# Patient Record
Sex: Male | Born: 1935 | Race: White | Hispanic: No | Marital: Married | Smoking: Former smoker
Health system: Southern US, Community
[De-identification: ages and names within clinical notes are randomized; demographics above are authoritative.]

## PROBLEM LIST (undated history)

## (undated) DIAGNOSIS — I219 Acute myocardial infarction, unspecified: Secondary | ICD-10-CM

## (undated) DIAGNOSIS — K219 Gastro-esophageal reflux disease without esophagitis: Secondary | ICD-10-CM

## (undated) DIAGNOSIS — I2510 Atherosclerotic heart disease of native coronary artery without angina pectoris: Secondary | ICD-10-CM

## (undated) DIAGNOSIS — E119 Type 2 diabetes mellitus without complications: Secondary | ICD-10-CM

## (undated) DIAGNOSIS — D649 Anemia, unspecified: Secondary | ICD-10-CM

## (undated) DIAGNOSIS — I10 Essential (primary) hypertension: Secondary | ICD-10-CM

## (undated) DIAGNOSIS — I739 Peripheral vascular disease, unspecified: Secondary | ICD-10-CM

## (undated) DIAGNOSIS — I779 Disorder of arteries and arterioles, unspecified: Secondary | ICD-10-CM

## (undated) DIAGNOSIS — I495 Sick sinus syndrome: Secondary | ICD-10-CM

## (undated) DIAGNOSIS — I4891 Unspecified atrial fibrillation: Secondary | ICD-10-CM

## (undated) DIAGNOSIS — I509 Heart failure, unspecified: Secondary | ICD-10-CM

## (undated) DIAGNOSIS — I714 Abdominal aortic aneurysm, without rupture, unspecified: Secondary | ICD-10-CM

## (undated) DIAGNOSIS — R011 Cardiac murmur, unspecified: Secondary | ICD-10-CM

## (undated) DIAGNOSIS — H9390 Unspecified disorder of ear, unspecified ear: Secondary | ICD-10-CM

## (undated) HISTORY — DX: Cardiac murmur, unspecified: R01.1

## (undated) HISTORY — DX: Unspecified atrial fibrillation: I48.91

## (undated) HISTORY — PX: CATARACT EXTRACTION, BILATERAL: SHX1313

## (undated) HISTORY — DX: Heart failure, unspecified: I50.9

## (undated) HISTORY — DX: Abdominal aortic aneurysm, without rupture: I71.4

## (undated) HISTORY — DX: Essential (primary) hypertension: I10

## (undated) HISTORY — DX: Atherosclerotic heart disease of native coronary artery without angina pectoris: I25.10

## (undated) HISTORY — DX: Gastro-esophageal reflux disease without esophagitis: K21.9

## (undated) HISTORY — DX: Anemia, unspecified: D64.9

## (undated) HISTORY — DX: Acute myocardial infarction, unspecified: I21.9

## (undated) HISTORY — DX: Type 2 diabetes mellitus without complications: E11.9

## (undated) HISTORY — PX: NASAL SEPTUM SURGERY: SHX37

## (undated) HISTORY — DX: Abdominal aortic aneurysm, without rupture, unspecified: I71.40

## (undated) HISTORY — DX: Disorder of arteries and arterioles, unspecified: I77.9

## (undated) HISTORY — DX: Sick sinus syndrome: I49.5

## (undated) HISTORY — DX: Peripheral vascular disease, unspecified: I73.9

---

## 1979-06-28 DIAGNOSIS — I2510 Atherosclerotic heart disease of native coronary artery without angina pectoris: Secondary | ICD-10-CM

## 1979-06-28 HISTORY — DX: Atherosclerotic heart disease of native coronary artery without angina pectoris: I25.10

## 1997-06-27 HISTORY — PX: CORONARY ARTERY BYPASS GRAFT: SHX141

## 1998-01-23 ENCOUNTER — Other Ambulatory Visit: Admission: RE | Admit: 1998-01-23 | Discharge: 1998-01-23 | Payer: Self-pay | Admitting: *Deleted

## 1998-06-10 ENCOUNTER — Inpatient Hospital Stay (HOSPITAL_COMMUNITY): Admission: AD | Admit: 1998-06-10 | Discharge: 1998-06-22 | Payer: Self-pay | Admitting: Cardiology

## 1998-06-10 ENCOUNTER — Encounter: Payer: Self-pay | Admitting: Cardiology

## 1998-06-11 ENCOUNTER — Encounter: Payer: Self-pay | Admitting: Thoracic Surgery (Cardiothoracic Vascular Surgery)

## 1998-06-12 ENCOUNTER — Encounter: Payer: Self-pay | Admitting: Thoracic Surgery (Cardiothoracic Vascular Surgery)

## 1998-06-13 ENCOUNTER — Encounter: Payer: Self-pay | Admitting: Thoracic Surgery (Cardiothoracic Vascular Surgery)

## 2004-08-09 ENCOUNTER — Ambulatory Visit (HOSPITAL_COMMUNITY): Admission: RE | Admit: 2004-08-09 | Discharge: 2004-08-09 | Payer: Self-pay | Admitting: Cardiology

## 2005-07-03 ENCOUNTER — Emergency Department (HOSPITAL_COMMUNITY): Admission: EM | Admit: 2005-07-03 | Discharge: 2005-07-03 | Payer: Self-pay | Admitting: Emergency Medicine

## 2006-12-13 ENCOUNTER — Ambulatory Visit: Payer: Self-pay | Admitting: Vascular Surgery

## 2007-06-26 ENCOUNTER — Ambulatory Visit: Payer: Self-pay | Admitting: Vascular Surgery

## 2007-07-18 ENCOUNTER — Encounter: Admission: RE | Admit: 2007-07-18 | Discharge: 2007-07-18 | Payer: Self-pay | Admitting: Internal Medicine

## 2007-08-01 ENCOUNTER — Encounter: Admission: RE | Admit: 2007-08-01 | Discharge: 2007-08-01 | Payer: Self-pay | Admitting: General Surgery

## 2007-08-01 ENCOUNTER — Other Ambulatory Visit: Admission: RE | Admit: 2007-08-01 | Discharge: 2007-08-01 | Payer: Self-pay | Admitting: Interventional Radiology

## 2007-08-01 ENCOUNTER — Encounter (INDEPENDENT_AMBULATORY_CARE_PROVIDER_SITE_OTHER): Payer: Self-pay | Admitting: Interventional Radiology

## 2007-12-04 ENCOUNTER — Ambulatory Visit: Payer: Self-pay | Admitting: Vascular Surgery

## 2008-06-03 ENCOUNTER — Ambulatory Visit: Payer: Self-pay | Admitting: Vascular Surgery

## 2008-12-23 ENCOUNTER — Ambulatory Visit: Payer: Self-pay | Admitting: Vascular Surgery

## 2009-01-27 ENCOUNTER — Ambulatory Visit: Payer: Self-pay | Admitting: Ophthalmology

## 2009-06-08 ENCOUNTER — Ambulatory Visit: Payer: Self-pay | Admitting: Vascular Surgery

## 2009-12-17 ENCOUNTER — Ambulatory Visit: Payer: Self-pay | Admitting: Vascular Surgery

## 2010-07-06 ENCOUNTER — Inpatient Hospital Stay (HOSPITAL_COMMUNITY)
Admission: EM | Admit: 2010-07-06 | Discharge: 2010-07-10 | Payer: Self-pay | Source: Home / Self Care | Attending: Cardiology | Admitting: Cardiology

## 2010-07-07 ENCOUNTER — Encounter (INDEPENDENT_AMBULATORY_CARE_PROVIDER_SITE_OTHER): Payer: Self-pay | Admitting: Emergency Medicine

## 2010-07-08 ENCOUNTER — Encounter: Payer: Self-pay | Admitting: Internal Medicine

## 2010-07-08 DIAGNOSIS — I495 Sick sinus syndrome: Secondary | ICD-10-CM

## 2010-07-08 HISTORY — DX: Sick sinus syndrome: I49.5

## 2010-07-08 HISTORY — PX: PACEMAKER INSERTION: SHX728

## 2010-07-09 ENCOUNTER — Ambulatory Visit: Admit: 2010-07-09 | Payer: Self-pay | Admitting: Vascular Surgery

## 2010-07-12 LAB — GLUCOSE, CAPILLARY
Glucose-Capillary: 134 mg/dL — ABNORMAL HIGH (ref 70–99)
Glucose-Capillary: 115 mg/dL — ABNORMAL HIGH (ref 70–99)
Glucose-Capillary: 100 mg/dL — ABNORMAL HIGH (ref 70–99)
Glucose-Capillary: 68 mg/dL — ABNORMAL LOW (ref 70–99)
Glucose-Capillary: 144 mg/dL — ABNORMAL HIGH (ref 70–99)
Glucose-Capillary: 132 mg/dL — ABNORMAL HIGH (ref 70–99)
Glucose-Capillary: 123 mg/dL — ABNORMAL HIGH (ref 70–99)
Glucose-Capillary: 157 mg/dL — ABNORMAL HIGH (ref 70–99)
Glucose-Capillary: 59 mg/dL — ABNORMAL LOW (ref 70–99)
Glucose-Capillary: 86 mg/dL (ref 70–99)
Glucose-Capillary: 203 mg/dL — ABNORMAL HIGH (ref 70–99)
Glucose-Capillary: 171 mg/dL — ABNORMAL HIGH (ref 70–99)
Glucose-Capillary: 169 mg/dL — ABNORMAL HIGH (ref 70–99)
Glucose-Capillary: 338 mg/dL — ABNORMAL HIGH (ref 70–99)
Glucose-Capillary: 99 mg/dL (ref 70–99)
Glucose-Capillary: 133 mg/dL — ABNORMAL HIGH (ref 70–99)
Glucose-Capillary: 144 mg/dL — ABNORMAL HIGH (ref 70–99)
Glucose-Capillary: 201 mg/dL — ABNORMAL HIGH (ref 70–99)
Glucose-Capillary: 152 mg/dL — ABNORMAL HIGH (ref 70–99)
Glucose-Capillary: 127 mg/dL — ABNORMAL HIGH (ref 70–99)
Glucose-Capillary: 89 mg/dL (ref 70–99)
Glucose-Capillary: 173 mg/dL — ABNORMAL HIGH (ref 70–99)

## 2010-07-12 LAB — BASIC METABOLIC PANEL
BUN: 27 mg/dL — ABNORMAL HIGH (ref 6–23)
BUN: 25 mg/dL — ABNORMAL HIGH (ref 6–23)
CO2: 25 mEq/L (ref 19–32)
CO2: 23 mEq/L (ref 19–32)
Calcium: 9.2 mg/dL (ref 8.4–10.5)
Calcium: 9 mg/dL (ref 8.4–10.5)
Chloride: 103 mEq/L (ref 96–112)
Chloride: 104 mEq/L (ref 96–112)
Creatinine, Ser: 1.45 mg/dL (ref 0.4–1.5)
Creatinine, Ser: 1.44 mg/dL (ref 0.4–1.5)
GFR calc Af Amer: 56 mL/min — ABNORMAL LOW (ref >60)
GFR calc Af Amer: 56 mL/min — ABNORMAL LOW (ref >60)
GFR calc non Af Amer: 46 mL/min — ABNORMAL LOW (ref >60)
GFR calc non Af Amer: 46 mL/min — ABNORMAL LOW (ref >60)
Glucose, Bld: 186 mg/dL — ABNORMAL HIGH (ref 70–99)
Glucose, Bld: 98 mg/dL (ref 70–99)
Potassium: 4.2 mEq/L (ref 3.5–5.1)
Potassium: 4.1 mEq/L (ref 3.5–5.1)
Sodium: 139 mEq/L (ref 135–145)
Sodium: 137 mEq/L (ref 135–145)

## 2010-07-12 LAB — DIFFERENTIAL
Basophils Absolute: 0.1 10*3/uL (ref 0.0–0.1)
Basophils Relative: 1 % (ref 0–1)
Eosinophils Absolute: 0.2 10*3/uL (ref 0.0–0.7)
Eosinophils Relative: 4 % (ref 0–5)
Lymphocytes Relative: 29 % (ref 12–46)
Lymphs Abs: 1.8 10*3/uL (ref 0.7–4.0)
Monocytes Absolute: 0.8 10*3/uL (ref 0.1–1.0)
Monocytes Relative: 13 % — ABNORMAL HIGH (ref 3–12)
Neutro Abs: 3.3 10*3/uL (ref 1.7–7.7)
Neutrophils Relative %: 54 % (ref 43–77)

## 2010-07-12 LAB — CK TOTAL AND CKMB (NOT AT ARMC)
CK, MB: 1.1 ng/mL (ref 0.3–4.0)
CK, MB: 1.2 ng/mL (ref 0.3–4.0)
CK, MB: 1.5 ng/mL (ref 0.3–4.0)
CK, MB: 1.5 ng/mL (ref 0.3–4.0)
Relative Index: INVALID (ref 0.0–2.5)
Relative Index: INVALID (ref 0.0–2.5)
Relative Index: INVALID (ref 0.0–2.5)
Relative Index: INVALID (ref 0.0–2.5)
Total CK: 40 U/L (ref 7–232)
Total CK: 37 U/L (ref 7–232)
Total CK: 36 U/L (ref 7–232)
Total CK: 56 U/L (ref 7–232)

## 2010-07-12 LAB — CBC
HCT: 34.1 % — ABNORMAL LOW (ref 39.0–52.0)
HCT: 33.7 % — ABNORMAL LOW (ref 39.0–52.0)
HCT: 34.4 % — ABNORMAL LOW (ref 39.0–52.0)
HCT: 31.8 % — ABNORMAL LOW (ref 39.0–52.0)
HCT: 29.9 % — ABNORMAL LOW (ref 39.0–52.0)
Hemoglobin: 10.7 g/dL — ABNORMAL LOW (ref 13.0–17.0)
Hemoglobin: 10.9 g/dL — ABNORMAL LOW (ref 13.0–17.0)
Hemoglobin: 11 g/dL — ABNORMAL LOW (ref 13.0–17.0)
Hemoglobin: 10.3 g/dL — ABNORMAL LOW (ref 13.0–17.0)
Hemoglobin: 9.7 g/dL — ABNORMAL LOW (ref 13.0–17.0)
MCH: 27.4 pg (ref 26.0–34.0)
MCH: 27.9 pg (ref 26.0–34.0)
MCH: 27.6 pg (ref 26.0–34.0)
MCH: 27.8 pg (ref 26.0–34.0)
MCH: 27.8 pg (ref 26.0–34.0)
MCHC: 31.4 g/dL (ref 30.0–36.0)
MCHC: 32.3 g/dL (ref 30.0–36.0)
MCHC: 32 g/dL (ref 30.0–36.0)
MCHC: 32.4 g/dL (ref 30.0–36.0)
MCHC: 32.4 g/dL (ref 30.0–36.0)
MCV: 87.4 fL (ref 78.0–100.0)
MCV: 86.2 fL (ref 78.0–100.0)
MCV: 86.4 fL (ref 78.0–100.0)
MCV: 85.9 fL (ref 78.0–100.0)
MCV: 85.7 fL (ref 78.0–100.0)
Platelets: 225 10*3/uL (ref 150–400)
Platelets: 243 10*3/uL (ref 150–400)
Platelets: 243 10*3/uL (ref 150–400)
Platelets: 252 10*3/uL (ref 150–400)
Platelets: 225 10*3/uL (ref 150–400)
RBC: 3.9 MIL/uL — ABNORMAL LOW (ref 4.22–5.81)
RBC: 3.91 MIL/uL — ABNORMAL LOW (ref 4.22–5.81)
RBC: 3.98 MIL/uL — ABNORMAL LOW (ref 4.22–5.81)
RBC: 3.7 MIL/uL — ABNORMAL LOW (ref 4.22–5.81)
RBC: 3.49 MIL/uL — ABNORMAL LOW (ref 4.22–5.81)
RDW: 14.8 % (ref 11.5–15.5)
RDW: 14.8 % (ref 11.5–15.5)
RDW: 14.7 % (ref 11.5–15.5)
RDW: 14.7 % (ref 11.5–15.5)
RDW: 14.8 % (ref 11.5–15.5)
WBC: 6.2 10*3/uL (ref 4.0–10.5)
WBC: 7 10*3/uL (ref 4.0–10.5)
WBC: 8.6 10*3/uL (ref 4.0–10.5)
WBC: 7.5 10*3/uL (ref 4.0–10.5)
WBC: 7.2 10*3/uL (ref 4.0–10.5)

## 2010-07-12 LAB — LIPID PANEL
Cholesterol: 119 mg/dL (ref 0–200)
HDL: 48 mg/dL (ref >39)
LDL Cholesterol: 60 mg/dL (ref 0–99)
Total CHOL/HDL Ratio: 2.5 RATIO
Triglycerides: 54 mg/dL (ref <150)
VLDL: 11 mg/dL (ref 0–40)

## 2010-07-12 LAB — PROTIME-INR
INR: 0.95 (ref 0.00–1.49)
Prothrombin Time: 12.9 seconds (ref 11.6–15.2)

## 2010-07-12 LAB — TROPONIN I
Troponin I: 0.01 ng/mL (ref 0.00–0.06)
Troponin I: 0.03 ng/mL (ref 0.00–0.06)
Troponin I: 0.07 ng/mL — ABNORMAL HIGH (ref 0.00–0.06)
Troponin I: 0.05 ng/mL (ref 0.00–0.06)

## 2010-07-12 LAB — COMPREHENSIVE METABOLIC PANEL
ALT: 10 U/L (ref 0–53)
AST: 15 U/L (ref 0–37)
Albumin: 3.7 g/dL (ref 3.5–5.2)
Alkaline Phosphatase: 48 U/L (ref 39–117)
BUN: 19 mg/dL (ref 6–23)
CO2: 23 mEq/L (ref 19–32)
Calcium: 9.3 mg/dL (ref 8.4–10.5)
Chloride: 107 mEq/L (ref 96–112)
Creatinine, Ser: 1.28 mg/dL (ref 0.4–1.5)
GFR calc Af Amer: >60 mL/min (ref >60)
GFR calc non Af Amer: 53 mL/min — ABNORMAL LOW (ref >60)
Glucose, Bld: 182 mg/dL — ABNORMAL HIGH (ref 70–99)
Potassium: 4 mEq/L (ref 3.5–5.1)
Sodium: 139 mEq/L (ref 135–145)
Total Bilirubin: 0.5 mg/dL (ref 0.3–1.2)
Total Protein: 6.4 g/dL (ref 6.0–8.3)

## 2010-07-12 LAB — HEPARIN LEVEL (UNFRACTIONATED)
Heparin Unfractionated: 0.5 IU/mL (ref 0.30–0.70)
Heparin Unfractionated: 0.32 IU/mL (ref 0.30–0.70)

## 2010-07-12 LAB — CARDIAC PANEL(CRET KIN+CKTOT+MB+TROPI)
CK, MB: 1.7 ng/mL (ref 0.3–4.0)
Relative Index: INVALID (ref 0.0–2.5)
Total CK: 45 U/L (ref 7–232)
Troponin I: 0.05 ng/mL (ref 0.00–0.06)

## 2010-07-12 LAB — APTT: aPTT: 42 seconds — ABNORMAL HIGH (ref 24–37)

## 2010-07-12 LAB — TSH: TSH: 0.369 u[IU]/mL (ref 0.350–4.500)

## 2010-07-12 LAB — HEMOGLOBIN A1C
Hgb A1c MFr Bld: 7.2 % — ABNORMAL HIGH (ref <5.7)
Mean Plasma Glucose: 160 mg/dL — ABNORMAL HIGH (ref <117)

## 2010-07-12 LAB — MAGNESIUM: Magnesium: 2.1 mg/dL (ref 1.5–2.5)

## 2010-07-19 NOTE — Discharge Summary (Addendum)
Steven Callahan, MEHRER NO.:  [id number]  MEDICAL RECORD NO.:  [id number]          Callahan TYPE:  INP  LOCATION:  2014                         FACILITY:  [facility]  PHYSICIAN:  Steven Pick. Eden Emms, Steven Callahan, Steven Callahan:  02-08-1921  DATE OF ADMISSION:  07/06/2010 DATE OF DISCHARGE:  07/10/2010                              DISCHARGE SUMMARY   DISCHARGE DIAGNOSES: 1. New onset atrial fibrillation with rapid ventricular response.     a.     Spontaneously converted to normal sinus rhythm, but had      significant bradycardia/pauses of 3-4 seconds with conversion,      status post Medtronic Adapta pacemaker implantation on July 07, 2010.     b.     Initiated on Pradaxa this admission. 2. Hypertension. 3. Chronic anxiety. 4. History bradycardia. 5. Coronary artery disease, status post remote coronary artery bypass     grafting in 1999. 6. Totally occluded left carotid artery. 7. Diabetes mellitus.8. Anemia with discharge hemoglobin of 9.7. 9. Status post nasal deviation surgery. 10.Status post cataract surgery.  HOSPITAL COURSE:  Steven Callahan is an 77 year old gentleman with a prior history of CAD and hypertension who was admitted with complaints of nausea and chest discomfort.  He took 2 nitroglycerin, but felt sick. He presented to Steven ER and his heart rate was in Steven 130s.  He was found to be in rapid AF and treated with IV Cardizem.  He had a 3.6-second pause, then a sinus beat, then a 4.2-second pause prior to conversion. Steven Callahan felt presyncopal at that time.  Initial changes were made to his medications on admission including reducing his metoprolol and diltiazem.  He was started on heparin.  His rate was monitored on telemetry and he was followed by Steven Callahan on Steven day of admission in Steven following day.  He was started on Pradaxa for CVA precaution.  Steven Callahan has seen Steven Callahan and felt that he would benefit from pacemaker implantation.  Steven  Callahan ultimately underwent this procedure on July 07, 2010, by Steven Callahan and received a Medtronic Adapta model.  Please see that report for details.  Steven Callahan did well postprocedurally.  However, on July 08, 2010, Steven Callahan appeared to have 2:1 flutter.  He was loaded with amiodarone and his Toprol and Cardizem were up-titrated.  He was on IV diltiazem drip for rate control.  His aspirin was decreased to 81 mg daily for rate control.  On Steven day of discharge, Steven Callahan has discontinued his diltiazem drip and he continues to be in Steven 70s, sinus.  He has received 3 doses of Pradaxa thus far.  Steven Callahan has seen and examined him today and feels he is stable for discharge.  DISCHARGE LABORATORY DATA:  WBC 7.2, hemoglobin 9.7, hematocrit 29.9, and platelet count 225.  Sodium 139, potassium 4.0, chloride 107, CO2 of 23, glucose 182, BUN 19, and creatinine 1.28.  LFTs were within normal limits on July 08, 2010.  STUDIES: 1. Chest x-ray on July 08, 2010, showed right pacer placement     without  pneumothorax.  COPD.  No active disease. 2. Implantation of dual-chamber pacemaker under fluoroscopy on July 07, 2010.  DISCHARGE MEDICATIONS: 1. Amiodarone 2 mg 2 tablets b.i.d. until July 21, 2010, then     changed to 1 tablet twice a day. 2. Pradaxa 150 mg every 12 hours. 3. Aspirin 81 mg daily. 4. Diltiazem 240 mg 2 tablets daily. 5. Metoprolol XL 50 mg daily. 6. Actos 15 mg every morning. 7. Alprazolam 0.5 mg nightly. 8. Amlodipine 5 mg every morning. 9. Bisacodyl 5 mg 1-2 tablets nightly p.r.n. constipation. 10.Cosopt 1 drop both eyes twice daily. 11.Glimepiride 4 mg daily. 12.HCTZ 25 mg every morning. 13.Lisinopril 10 mg b.i.d. 14.Metformin 500 mg b.i.d. 15.Nitroglycerin sublingual 0.4 mg every 5 minutes as needed up to 3     doses. 16.Zocor 20 mg 2 q.a.m.  DISCHARGE DISPOSITION:  Steven Callahan will be discharged in stable condition to home.  He is to  increase activity slowly and was given Steven pacemaker 6 figure sheet for specific instructions regarding bathing, wound care, and activity.  He is to follow a low-sodium, heart-healthy, diabetic diet.  He will have appointments arranged in [facility] as well as Steven Callahan.  Our office will call with these appointments. He is also instructed to follow up with Dr. Timothy Lasso to discuss further evaluation of his anemia, which remained stable this admission.  DURATION OF DISCHARGE ENCOUNTER:  Greater than 30 minutes including physician and PA time.     Steven Callahan, P.A.C.   ______________________________ Steven Pick Eden Emms, Steven Callahan, [facility]    DD/MEDQ  D:  07/10/2010  T:  07/11/2010  Job:  [id number]  cc:   Gwen Pounds, Steven Callahan  Electronically Signed by Charlton Haws Steven Callahan [facility] on 07/13/2010 01:01:00 PM Electronically Signed by Ronie Spies  on 07/19/2010 10:32:58 AM

## 2010-07-20 ENCOUNTER — Ambulatory Visit: Payer: Self-pay | Admitting: Cardiology

## 2010-07-21 ENCOUNTER — Encounter: Payer: Self-pay | Admitting: Internal Medicine

## 2010-07-21 ENCOUNTER — Ambulatory Visit: Admission: RE | Admit: 2010-07-21 | Discharge: 2010-07-21 | Payer: Self-pay | Source: Home / Self Care

## 2010-07-22 NOTE — Procedures (Addendum)
  NAMEALWYN, CORDNER Steven.:  [id number]  MEDICAL RECORD Steven.:  [id number]          PATIENT TYPE:  INP  LOCATION:  2014                         FACILITY:  [facility]  PHYSICIAN:  Colleen Can. Deborah Chalk, M.D.DATE OF BIRTH:  February 15, 1921  DATE OF PROCEDURE:  07/07/2010 DATE OF DISCHARGE:                           CARDIAC CATHETERIZATION   PROCEDURE:  Implantation of dual-chamber pulse generating system under fluoroscopy.  INDICATIONS FOR PROCEDURE:  Tachybrady syndrome with atrial fibrillation and prolonged pauses with conversion.  PROCEDURE:  The right subclavicular area was prepped and draped.  The area was infiltrated with 1% Xylocaine.  Subcutaneous pocket was created at the prepectoral fascia.  Two punctures were made into the subclavian vein over top of the first rib.  Using 7-[facility] introducers, the atrial ventricular leads were introduced.  Ventricular lead was a Guidant model 4471, 58-cm lead, serial number [id number].  The ventricular thresholds show an R wave of 10.4 mV.  Impedance 459 ohms. Threshold 0.6 volts at 0.5 milliseconds pulse width with a current of 1.6 MA.  The atrial lead was a Guidant model 4470, 52-cm lead, serial number [id number].  It was positioned in the right atrial appendage.  The P-waves measured 1.9 volts.  Impedance was 429 ohms.  Threshold was 1.1 volts at 0.5 milliseconds pulse width.  Current was 2.9 MA.  Both leads were sutured in place.  The wound was flushed with gentamicin solution.  The leads were connected to a Medtronic Adapta ADDRAO1, serial number [id number].  The unit was sutured in place.  The wound was closed with 2- 0 and subsequently 4-0 Vicryl.  Steri-Strips were applied.  Overall the patient tolerated the procedure well.     Colleen Can. Deborah Chalk, M.D.     SNT/MEDQ  D:  07/07/2010  T:  07/08/2010  Job:  [id number]  Electronically Signed by Roger Shelter M.D. on 07/22/2010 03:37:28 PM

## 2010-07-22 NOTE — H&P (Addendum)
NAMEMARTINO, TOMPSON NO.:  [id number]  MEDICAL RECORD NO.:  [id number]          PATIENT TYPE:  EMS  LOCATION:  MAJO                         FACILITY:  [facility]  PHYSICIAN:  Rollene Rotunda, MD, FACCDATE OF BIRTH:  03-23-1921  DATE OF ADMISSION:  07/06/2010 DATE OF DISCHARGE:                             HISTORY & PHYSICAL   PRIMARY CARE PHYSICIAN:  Gwen Pounds, MD  CARDIOLOGY:  Colleen Can. Deborah Chalk, M.D.  REASON FOR PRESENTATION:  The patient with atrial fibrillation.  HISTORY OF PRESENT ILLNESS:  The patient is a very pleasant 77 year old white gentleman with past history of coronary disease.  I do see a previous EKG with atrial fibrillation.  The patient does not recall this history.  Tonight he was doing well, watching a football game.  He developed sudden nausea.  He had some chest discomfort that was substernal.  He took 2 nitroglycerins.  He felt "sick."  He had some slight lightheadedness, but no frank syncope.  He did take his blood pressure and though his systolics were in 160s and he was not hypotensive.  He did have heart rate in 130s.  He called EMS.  He is found in the emergency room to be in rapid atrial fibrillation.  He was treated with IV Cardizem.  He had 2 pauses.  The first was followed by couple of sinus beats then a continuation of atrial fibrillation.  The second pause was a prolonged pause, followed then by sinus bradycardia. He did feel presyncopal with both of these briefly.  He is no longer having any chest pressure.  He has had no neck or arm discomfort.  He had no vomiting.  He has had no shortness of breath.  The patient otherwise has been well.  He is vigorously active and cannot bring on any symptoms.  PAST MEDICAL HISTORY:  Coronary artery disease, hypertension, hyperlipidemia, diabetes mellitus, atrial fibrillation, gastroesophageal reflux disease, carotid stenosis (left 100% stenosis), anemia (the patient is supposed to be  taking iron but does not).  PAST SURGICAL HISTORY:  CABG (1999 LIMA to LAD, SVG to OM, SVG to PDA), nasal deviated, septal surgery, cataracts.  ALLERGIES:  IBUPROFEN.  MEDICATIONS: 1. Simvastatin 20 mg daily. 2. Amlodipine 5 mg daily. 3. Diltiazem 300 mg daily. 4. Glimepiride 4 mg daily. 5. Hydrochlorothiazide 25 mg daily. 6. Metformin 500 mg b.i.d. 7. Alprazolam 0.5 mg at bedtime. 8. Lisinopril 10 mg b.i.d. 9. Lopressor 50 mg daily. 10.Dulcolax. 11.Actos 15 mg daily.  SOCIAL HISTORY:  The patient is married.  He smoked off and on up until age 8.  He is retired, been active on his property.  FAMILY HISTORY:  Noncontributory for early coronary artery disease.  REVIEW OF SYSTEMS:  As stated in the HPI and positive for mild lower extremity swelling, constipation, cold intolerance.  Negative for all other systems.  PHYSICAL EXAMINATION:  GENERAL:  The patient is a pleasant and in no distress. VITAL SIGNS:  Blood pressure 145/59, heart rate 56 and regular, afebrile, respiratory rate 15.  HEENT:  Eyes unremarkable, pupils equal, round, and reactive to light.  Fundi not visualized.  Oral mucosa normal. NECK:  No jugular distention at 45 degrees.  Carotid upstroke on the right brisk and symmetric, left carotid bruit.  No thyromegaly. LYMPHATICS:  No cervical, axillary, inguinal adenopathy. LUNGS:  Clear to auscultation bilaterally. BACK:  No costovertebral angle tenderness. CHEST:  Well-healed sternotomy scar. HEART:  PMI not displaced or sustained, S1-S2 within normal limits.  No S3, no S4, no clicks, no rubs, 2/6 apical systolic murmur radiating slightly out the aortic outflow tract, 2/6 apical diastolic murmur heard also at the lower left sternal border. SKIN:  No rashes, no nodules. EXTREMITIES:  Pulses 2+ throughout, mild bilateral ankle edema. NEURO:  Oriented to person, place, time.  Cranial nerves II-XII grossly intact, motor grossly intact.  EKG:  Atrial  fibrillation, rate 124, left axis deviation, borderline interventricular conduction delay, poor anterior R-wave progression, QTC prolonged.  LABORATORY DATA:  Troponin 0.01, CK 40, MB 1.1.  Sodium 139, potassium 4.2, BUN 27, creatinine 1.43.  Hemoglobin 10.7, platelets 225, WBC 6.2.  Chest x-ray, central venous congestion.  ASSESSMENT AND PLAN: 1. Atrial fibrillation.  The patient had paroxysmal atrial     fibrillation with rapid rate.  What is concerning is a length of     the termination pauses.  He is also symptomatic with this.  Also     concerned that he needs high dose of medications to control his     blood pressure.  I think given this even knows the first episode,     he will need to be considered for pacemaker.  I will have been in     the hospital and start him on heparin.  Check a stool guaiacs as he     is anemic.  I will be giving him his beta-blocker and his Cardizem     as his blood pressure is elevated.  However, given a lower dose of     beta-blocker and Cardizem in divided doses with his bradycardia.  I     will continue his other meds as listed. 2. Murmur.  The patient has a heart murmur which represents aortic     stenosis, probably mild with some aortic insufficiency.  I will     check an echocardiogram. 3. Anemia.  Apparently this has been chronic and he supposed to be     taking iron.  We will need to make sure that he has no active     bleeding and I will check guaiac as need to be treated with heparin     and Coumadin if there are no contraindications. 4. Hypertension.  This is apparently difficult to control as he is on     multiple medications.  I will continue the meds as listed with     changes as above short-term. 5. Diabetes.  He will continue his previous medications with sliding     scale insulin. 6. Coronary artery disease.  He will continue with medications as     listed above.  I will cycle cardiac enzymes given chest pain. 7. Dyslipidemia.  I  will check a fasting lipid profile.     Rollene Rotunda, MD, [facility]     JH/MEDQ  D:  07/06/2010  T:  07/06/2010  Job:  [id number]  Electronically Signed by Rollene Rotunda MD [facility] on 07/22/2010 12:28:50 PM

## 2010-07-29 ENCOUNTER — Other Ambulatory Visit (INDEPENDENT_AMBULATORY_CARE_PROVIDER_SITE_OTHER): Payer: BLUE CROSS/BLUE SHIELD

## 2010-07-29 DIAGNOSIS — I6529 Occlusion and stenosis of unspecified carotid artery: Secondary | ICD-10-CM

## 2010-07-29 NOTE — Miscellaneous (Signed)
Summary: Device preload  Clinical Lists Changes  Observations: Added new observation of PPM INDICATN: A-fib (07/08/2010 12:56) Added new observation of MAGNET RTE: BOL 85 ERI 65 (07/08/2010 12:56) Added new observation of PPMLEADSTAT2: active (07/08/2010 12:56) Added new observation of PPMLEADSER2: 332951  (07/08/2010 12:56) Added new observation of PPMLEADMOD2: 4471  (07/08/2010 12:56) Added new observation of PPMLEADLOC2: RV  (07/08/2010 12:56) Added new observation of PPMLEADSTAT1: active  (07/08/2010 12:56) Added new observation of PPMLEADSER1: 884166  (07/08/2010 12:56) Added new observation of PPMLEADMOD1: 4470  (07/08/2010 12:56) Added new observation of PPMLEADLOC1: RA  (07/08/2010 12:56) Added new observation of PPM IMP MD: Duffy Rhody Tennant,MD  (07/08/2010 12:56) Added new observation of PPMLEADDOI2: 07/07/2010  (07/08/2010 12:56) Added new observation of PPMLEADDOI1: 07/07/2010  (07/08/2010 12:56) Added new observation of PPM DOI: 07/07/2010  (07/08/2010 12:56) Added new observation of PPM SERL#: [id number]  (07/08/2010 12:56) Added new observation of PPM MODL#: [id number]  (07/08/2010 93:23) Added new observation of PACEMAKERMFG: Medtronic  (07/08/2010 12:56) Added new observation of PACEMAKER MD: Hillis Range, MD  (07/08/2010 12:56)      PPM Specifications Following MD:  Hillis Range, MD     PPM Vendor:  Medtronic     PPM Model Number:  ADDR01     PPM Serial Number:  [id number] PPM DOI:  07/07/2010     PPM Implanting MD:  Rolla Plate  Lead 1    Location: RA     DOI: 07/07/2010     Model #: 4470     Serial #: [id number]     Status: active Lead 2    Location: RV     DOI: 07/07/2010     Model #: 4471     Serial #: [id number]     Status: active  Magnet Response Rate:  BOL 85 ERI 65  Indications:  A-fib

## 2010-08-04 NOTE — Procedures (Signed)
Summary: 10 day wound ck/mt   Current Medications (verified): 1)  Amiodarone Hcl 200 Mg Tabs (Amiodarone Hcl) .... One By Mouth Two Times A Day 2)  Pradaxa 150 Mg Caps (Dabigatran Etexilate Mesylate) .... One By Mouth Every 12 Hours 3)  Aspir-Low 81 Mg Tbec (Aspirin) .... One By Mouth Daily 4)  Diltiazem Hcl Er Beads 240 Mg Xr24h-Cap (Diltiazem Hcl Er Beads) .... 2 By Mouth Daily 5)  Metoprolol Tartrate 50 Mg Tabs (Metoprolol Tartrate) .... One By Mouth Daily 6)  Actos 45 Mg Tabs (Pioglitazone Hcl) .... One By Mouth Daily 7)  Alprazolam 0.5 Mg Tabs (Alprazolam) .... One By Mouth At Bedtime 8)  Amlodipine Besylate 5 Mg Tabs (Amlodipine Besylate) .... One By Mouth Daily 9)  Bisacodyl Ec 5 Mg Tbec (Bisacodyl) .... As Needed 10)  Cosopt 22.3-6.8 Mg/ml Soln (Dorzolamide Hcl-Timolol Mal) .Marland Kitchen.. 1 Drop in Both Eyes Two Times A Day 11)  Glimepiride 4 Mg Tabs (Glimepiride) .... One By Mouth Daily 12)  Hydrochlorothiazide 25 Mg Tabs (Hydrochlorothiazide) .... One By Mouth Daily 13)  Lisinopril 10 Mg Tabs (Lisinopril) .... 2 By Mouth Daily 14)  Metformin Hcl 500 Mg Tabs (Metformin Hcl) .... One By Mouth Two Times A Day 15)  Simvastatin 20 Mg Tabs (Simvastatin) .... One By Mouth Daily 16)  Nitro-Dur 0.4 Mg/hr Pt24 (Nitroglycerin) .... As Needed  Allergies (verified): 1)  ! Advil  PPM Specifications Following MD:  Hillis Range, MD     PPM Vendor:  Medtronic     PPM Model Number:  ADDR01     PPM Serial Number:  [id number] PPM DOI:  07/07/2010     PPM Implanting MD:  Rolla Plate  Lead 1    Location: RA     DOI: 07/07/2010     Model #: 4470     Serial #: [id number]     Status: active Lead 2    Location: RV     DOI: 07/07/2010     Model #: 4471     Serial #: [id number]     Status: active  Magnet Response Rate:  BOL 85 ERI 65  Indications:  A-fib   PPM Follow Up Remote Check?  No Battery Voltage:  2.79 V     Battery Est. Longevity:  11 years     Pacer Dependent:  Yes       PPM Device  Measurements Atrium  Amplitude: 2.0 mV, Impedance: 412 ohms, Threshold: 0.75 V at 0.4 msec Right Ventricle  Amplitude: 11.20 mV, Impedance: 614 ohms, Threshold: 0.375 V at 0.4 msec  Episodes MS Episodes:  799     Percent Mode Switch:  8.3%     Coumadin:  No Ventricular High Rate:  0     Atrial Pacing:  50.7%     Ventricular Pacing:  0.7%  Parameters Mode:  DDDR+     Lower Rate Limit:  60     Upper Rate Limit:  120 Paced AV Delay:  180     Sensed AV Delay:  150 Next Cardiology Appt Due:  08/04/2010 Tech Comments:  Steri strips removed, moderate sized resolving hematoma noted.  Rate response on today.  We will recheck in 2 weeks and he will see Dr. Johney Frame in 3 months.   Altha Harm, LPN  July 21, 2010 11:02 AM

## 2010-08-04 NOTE — Cardiovascular Report (Signed)
Summary: Office Visit   Office Visit   Imported By: Roderic Ovens 07/26/2010 16:10:38  _____________________________________________________________________  External Attachment:    Type:   Image     Comment:   External Document

## 2010-08-05 ENCOUNTER — Ambulatory Visit (INDEPENDENT_AMBULATORY_CARE_PROVIDER_SITE_OTHER): Payer: Medicare Other

## 2010-08-05 ENCOUNTER — Encounter: Payer: Self-pay | Admitting: Internal Medicine

## 2010-08-05 DIAGNOSIS — I495 Sick sinus syndrome: Secondary | ICD-10-CM

## 2010-08-06 NOTE — Procedures (Unsigned)
CAROTID DUPLEX EXAM  INDICATION:  Carotid disease.  HISTORY: Diabetes:  Yes. Cardiac:  MI, CABG, recently placed pacemaker. Hypertension:  Yes. Smoking:  Previous. Previous Surgery:  No. CV History:  Currently asymptomatic. Amaurosis Fugax No, Paresthesias No, Hemiparesis No                                      RIGHT             LEFT Brachial systolic pressure:         130               136 Brachial Doppler waveforms:         Normal            Normal Vertebral direction of flow:        Antegrade DUPLEX VELOCITIES (cm/sec) CCA peak systolic                   140 ECA peak systolic                   165 ICA peak systolic                   240 ICA end diastolic                   40 PLAQUE MORPHOLOGY:                  Heterogeneous PLAQUE AMOUNT:                      Moderate PLAQUE LOCATION:                    ICA / ECA / CCA  IMPRESSION: 1. Doppler velocities suggest high end 40% to 59% stenosis of the     right proximal internal carotid artery. 2. Known left internal carotid artery occlusion. 3. No significant change noted when compared to the previous exam on     12/17/2009.  ___________________________________________ Di Kindle. Edilia Bo, M.D.  CH/MEDQ  D:  07/30/2010  T:  07/30/2010  Job:  [id number]

## 2010-08-10 ENCOUNTER — Ambulatory Visit (HOSPITAL_COMMUNITY): Payer: Medicare Other | Attending: Cardiology

## 2010-08-10 ENCOUNTER — Ambulatory Visit (INDEPENDENT_AMBULATORY_CARE_PROVIDER_SITE_OTHER): Payer: Medicare Other | Admitting: *Deleted

## 2010-08-10 DIAGNOSIS — I4891 Unspecified atrial fibrillation: Secondary | ICD-10-CM

## 2010-08-10 DIAGNOSIS — E119 Type 2 diabetes mellitus without complications: Secondary | ICD-10-CM | POA: Insufficient documentation

## 2010-08-10 DIAGNOSIS — R011 Cardiac murmur, unspecified: Secondary | ICD-10-CM

## 2010-08-10 DIAGNOSIS — I379 Nonrheumatic pulmonary valve disorder, unspecified: Secondary | ICD-10-CM | POA: Insufficient documentation

## 2010-08-10 DIAGNOSIS — R5381 Other malaise: Secondary | ICD-10-CM | POA: Insufficient documentation

## 2010-08-10 DIAGNOSIS — E785 Hyperlipidemia, unspecified: Secondary | ICD-10-CM | POA: Insufficient documentation

## 2010-08-10 DIAGNOSIS — I10 Essential (primary) hypertension: Secondary | ICD-10-CM | POA: Insufficient documentation

## 2010-08-10 DIAGNOSIS — D649 Anemia, unspecified: Secondary | ICD-10-CM

## 2010-08-10 DIAGNOSIS — I080 Rheumatic disorders of both mitral and aortic valves: Secondary | ICD-10-CM | POA: Insufficient documentation

## 2010-08-10 DIAGNOSIS — I079 Rheumatic tricuspid valve disease, unspecified: Secondary | ICD-10-CM | POA: Insufficient documentation

## 2010-08-13 ENCOUNTER — Ambulatory Visit (HOSPITAL_COMMUNITY)
Admission: RE | Admit: 2010-08-13 | Discharge: 2010-08-13 | Disposition: A | Discharge status: Home / Self Care | Payer: Medicare Other | Source: Ambulatory Visit | Attending: Cardiovascular Disease | Admitting: Cardiovascular Disease

## 2010-08-13 DIAGNOSIS — I059 Rheumatic mitral valve disease, unspecified: Secondary | ICD-10-CM | POA: Insufficient documentation

## 2010-08-13 DIAGNOSIS — I339 Acute and subacute endocarditis, unspecified: Secondary | ICD-10-CM

## 2010-08-13 DIAGNOSIS — I359 Nonrheumatic aortic valve disorder, unspecified: Secondary | ICD-10-CM | POA: Insufficient documentation

## 2010-08-13 DIAGNOSIS — I379 Nonrheumatic pulmonary valve disorder, unspecified: Secondary | ICD-10-CM | POA: Insufficient documentation

## 2010-08-13 DIAGNOSIS — Z950 Presence of cardiac pacemaker: Secondary | ICD-10-CM | POA: Insufficient documentation

## 2010-08-13 LAB — GLUCOSE, CAPILLARY: Glucose-Capillary: 136 mg/dL — ABNORMAL HIGH (ref 70–99)

## 2010-08-16 ENCOUNTER — Ambulatory Visit (INDEPENDENT_AMBULATORY_CARE_PROVIDER_SITE_OTHER): Payer: Medicare Other | Admitting: *Deleted

## 2010-08-16 DIAGNOSIS — I359 Nonrheumatic aortic valve disorder, unspecified: Secondary | ICD-10-CM

## 2010-08-16 DIAGNOSIS — I2510 Atherosclerotic heart disease of native coronary artery without angina pectoris: Secondary | ICD-10-CM

## 2010-08-16 DIAGNOSIS — I4891 Unspecified atrial fibrillation: Secondary | ICD-10-CM

## 2010-08-18 NOTE — Cardiovascular Report (Signed)
Summary: Office Visit   Office Visit   Imported By: Roderic Ovens 08/10/2010 14:19:18  _____________________________________________________________________  External Attachment:    Type:   Image     Comment:   External Document

## 2010-08-18 NOTE — Procedures (Signed)
Summary: PACER SITE CHECK PER PAULA/SL   Current Medications (verified): 1)  Amiodarone Hcl 200 Mg Tabs (Amiodarone Hcl) .... One By Mouth Two Times A Day 2)  Pradaxa 150 Mg Caps (Dabigatran Etexilate Mesylate) .... One By Mouth Every 12 Hours 3)  Aspir-Low 81 Mg Tbec (Aspirin) .... One By Mouth Daily 4)  Diltiazem Hcl Er Beads 240 Mg Xr24h-Cap (Diltiazem Hcl Er Beads) .... 2 By Mouth Daily 5)  Metoprolol Tartrate 50 Mg Tabs (Metoprolol Tartrate) .... One By Mouth Daily 6)  Actos 45 Mg Tabs (Pioglitazone Hcl) .... One By Mouth Daily 7)  Alprazolam 0.5 Mg Tabs (Alprazolam) .... One By Mouth At Bedtime 8)  Amlodipine Besylate 5 Mg Tabs (Amlodipine Besylate) .... One By Mouth Daily 9)  Bisacodyl Ec 5 Mg Tbec (Bisacodyl) .... As Needed 10)  Cosopt 22.3-6.8 Mg/ml Soln (Dorzolamide Hcl-Timolol Mal) .Marland Kitchen.. 1 Drop in Both Eyes Two Times A Day 11)  Glimepiride 4 Mg Tabs (Glimepiride) .... One By Mouth Daily 12)  Hydrochlorothiazide 25 Mg Tabs (Hydrochlorothiazide) .... One By Mouth Daily 13)  Lisinopril 10 Mg Tabs (Lisinopril) .... 2 By Mouth Daily 14)  Metformin Hcl 500 Mg Tabs (Metformin Hcl) .... One By Mouth Two Times A Day 15)  Simvastatin 20 Mg Tabs (Simvastatin) .... One By Mouth Daily 16)  Nitro-Dur 0.4 Mg/hr Pt24 (Nitroglycerin) .... As Needed  Allergies (verified): 1)  ! Advil  PPM Specifications Following MD:  Hillis Range, MD     PPM Vendor:  Medtronic     PPM Model Number:  ADDR01     PPM Serial Number:  [id number] PPM DOI:  07/07/2010     PPM Implanting MD:  Duffy Rhody Tennant,MD  Lead 1    Location: RA     DOI: 07/07/2010     Model #: 4470     Serial #: [id number]     Status: active Lead 2    Location: RV     DOI: 07/07/2010     Model #: 4471     Serial #: [id number]     Status: active  Magnet Response Rate:  BOL 85 ERI 65  Indications:  A-fib   PPM Follow Up Battery Voltage:  2.79 V     Battery Est. Longevity:  9 yrs     Pacer Dependent:  Yes       PPM Device Measurements Atrium   Impedance: 428 ohms, Threshold: 0.750 V at 0.40 msec Right Ventricle  Amplitude: 16.0 mV, Impedance: 584 ohms, Threshold: 0.375 V at 0.40 msec  Episodes MS Episodes:  0     Coumadin:  No Ventricular High Rate:  0     Atrial Pacing:  97.8%     Ventricular Pacing:  0.3%  Parameters Mode:  MVP     Lower Rate Limit:  60     Upper Rate Limit:  120 Paced AV Delay:  180     Sensed AV Delay:  150 Next Cardiology Appt Due:  10/06/2010 Tech Comments:  PT IN FOR WOUND RECHECK---HEMATOMA RESOLVING.  PT COMPLAINING OF FAST HEART RATE W/LITTLE ACTIVITY.  AT LAST CHECK RATE RESPONSE TURNED ON.  TRIED TO DECREASE ADL & EXERTION RESPONSE---WALKED PT SEVERAL TIMES.  PT FELT THE BEST W/RATE RESPONSE TURNED OFF.  DR Johney Frame EVALUATED SITE. PT PREFERS OV RATHER THAN CAREL[facility].  ROV IN 3 MTHS W/JA. Vella Kohler  August 05, 2010 11:54 AM

## 2010-08-19 LAB — CULTURE, BLOOD (ROUTINE X 2)
Culture  Setup Time: 201202171820
Culture  Setup Time: 201202171820

## 2010-09-06 ENCOUNTER — Encounter: Payer: Self-pay | Admitting: Cardiology

## 2010-09-06 DIAGNOSIS — Z950 Presence of cardiac pacemaker: Secondary | ICD-10-CM | POA: Insufficient documentation

## 2010-09-06 DIAGNOSIS — I2510 Atherosclerotic heart disease of native coronary artery without angina pectoris: Secondary | ICD-10-CM | POA: Insufficient documentation

## 2010-09-06 DIAGNOSIS — I219 Acute myocardial infarction, unspecified: Secondary | ICD-10-CM | POA: Insufficient documentation

## 2010-09-06 DIAGNOSIS — I6529 Occlusion and stenosis of unspecified carotid artery: Secondary | ICD-10-CM | POA: Insufficient documentation

## 2010-09-06 DIAGNOSIS — D649 Anemia, unspecified: Secondary | ICD-10-CM | POA: Insufficient documentation

## 2010-09-06 DIAGNOSIS — I495 Sick sinus syndrome: Secondary | ICD-10-CM | POA: Insufficient documentation

## 2010-09-06 DIAGNOSIS — I4891 Unspecified atrial fibrillation: Secondary | ICD-10-CM | POA: Insufficient documentation

## 2010-09-06 DIAGNOSIS — I10 Essential (primary) hypertension: Secondary | ICD-10-CM | POA: Insufficient documentation

## 2010-09-28 ENCOUNTER — Encounter: Payer: Self-pay | Admitting: Cardiology

## 2010-09-28 ENCOUNTER — Ambulatory Visit (INDEPENDENT_AMBULATORY_CARE_PROVIDER_SITE_OTHER): Payer: Medicare Other | Admitting: Cardiology

## 2010-09-28 DIAGNOSIS — I10 Essential (primary) hypertension: Secondary | ICD-10-CM

## 2010-09-28 DIAGNOSIS — I4891 Unspecified atrial fibrillation: Secondary | ICD-10-CM

## 2010-09-28 DIAGNOSIS — I495 Sick sinus syndrome: Secondary | ICD-10-CM

## 2010-09-29 ENCOUNTER — Encounter: Payer: Self-pay | Admitting: Cardiology

## 2010-09-29 NOTE — Progress Notes (Signed)
Steven Callahan is seen today for followup of his shortness of breath on exertion and difficulty walking, and weakness. He claims to not really felt well since his pacemaker in January. He's had a transesophageal echo in February but failed to show any evidence of vegetations. He has had blood cultures which have been negative. He continues to feel weak and washed out. His pacer was interrogated today and functioning fine with some atrial pacing but no arrhythmia or high rates and very little with ventricular pacing at this time. It is not felt that his symptoms are related to his pacemaker but they may be in fact related to his amiodarone. Dr. Timothy Callahan stopped his amiodarone 2-3 days ago. We've yet to see any degree of improvement. Lab work from Dr. Ferd Callahan office is reviewed today. ANA was negative. RA latex was negative. PFTs are ordered. BUN was 29 with a creatinine of 1.9. Hematocrit is 31.1. Vitamin D levels were low. Sedimentation rate was 67. His chart and exam were reviewed and is not clear as to any one particular issue that is creating the weakness. Steven Callahan will be 56 later this year.   Current Outpatient Prescriptions  Medication Sig Dispense Refill  . ALPRAZolam (XANAX) 0.5 MG tablet Take 0.5 mg by mouth at bedtime.        Marland Kitchen aspirin 325 MG tablet Take 81 mg by mouth daily.       . bisacodyl (DULCOLAX) 5 MG EC tablet Take 5 mg by mouth daily as needed.        . dabigatran (PRADAXA) 150 MG CAPS Take 150 mg by mouth every 12 (twelve) hours.        Marland Kitchen diltiazem (DILACOR XR) 240 MG 24 hr capsule Take 240 mg by mouth 2 (two) times daily.        . hydrochlorothiazide 25 MG tablet Take 25 mg by mouth daily.        . IRON PO Take 1 tablet by mouth daily.        Marland Kitchen lisinopril (PRINIVIL,ZESTRIL) 20 MG tablet Take 10 mg by mouth 2 (two) times daily. 2 in the am and 2 in the pm      . metFORMIN (GLUCOPHAGE) 1000 MG tablet Take 500 mg by mouth 2 (two) times daily. 2 in the am and 2 in the pm      . metoprolol  (TOPROL-XL) 50 MG 24 hr tablet Take 50 mg by mouth daily.        . nitroGLYCERIN (NITROSTAT) 0.4 MG SL tablet Place 0.4 mg under the tongue every 5 (five) minutes as needed.        . pioglitazone (ACTOS) 45 MG tablet Take 45 mg by mouth daily.        . simvastatin (ZOCOR) 20 MG tablet Take 20 mg by mouth at bedtime.        Marland Kitchen amiodarone (PACERONE) 200 MG tablet Take 200 mg by mouth daily.          Allergies  Allergen Reactions  . Ibuprofen Hives    Patient Active Problem List  Diagnoses  . Pacemaker  . HTN (hypertension)  . Carotid artery disease  . Coronary artery disease  . Myocardial infarction  . Tachy-brady syndrome  . Atrial fibrillation  . Carotid stenosis  . Anemia    History  Smoking status  . Former Smoker  . Types: Cigarettes  . Quit date: 09/05/1980  Smokeless tobacco  . Not on file    History  Alcohol Use  Family History  Problem Relation Age of Onset  . Stroke Father   . Bone cancer Mother   . Coronary artery disease Child   . Coronary artery disease Child     Review of Systems:   The patient denies any heat or cold intolerance.  No weight gain or weight loss.  The patient denies headaches or blurry vision.  There is no cough or sputum production.  The patient denies dizziness.  There is no hematuria or hematochezia.  The patient denies any muscle aches or arthritis.  The patient denies any rash.  The patient denies frequent falling or instability.  There is no history of depression or anxiety.  All other systems were reviewed and are negative.   Physical Exam:   Weights 176. Blood pressure 174/78 heart rate is 64.The head is normocephalic and atraumatic.  Pupils are equally round and reactive to light.  Sclerae nonicteric.  Conjunctiva is clear.  Oropharynx is unremarkable.  There's adequate oral airway.  Neck is supple there are no masses.  Thyroid is not enlarged.  There is no lymphadenopathy.  Lungs are clear.  Chest is symmetric.pacemaker in the  right upper chest which is well healed he  Heart shows a regular rate and rhythm.  S1 and S2 are normal. Is a soft systolic outflow murmur..  Abdomen is soft normal bowel sounds.  There is no organomegaly.  Genital and rectal deferred.  Extremities are without edema.  Peripheral pulses are adequate.  Neurologically intact.  Full range of motion.  The patient is not depressed.  Skin is warm and dry. Assessment / Plan:

## 2010-09-29 NOTE — Assessment & Plan Note (Signed)
Amiodarone was stopped on April 2.

## 2010-09-29 NOTE — Assessment & Plan Note (Signed)
I don't think he is having atrial tachycardia or atrial fibrillation at this point in time. We'll discontinue his amiodarone since that may be creating a drug toxicity for him. I will see him again in a month. Dr. Timothy Lasso will see him back for all of his medical followup otherwise.his pacer seems to be functioning fine and was fully interrogated

## 2010-09-29 NOTE — Assessment & Plan Note (Signed)
Blood pressure is elevated today. In general, blood pressure readings have been satisfactory at we will not change current medical regimen.

## 2010-10-08 ENCOUNTER — Ambulatory Visit (HOSPITAL_COMMUNITY)
Admission: RE | Admit: 2010-10-08 | Discharge: 2010-10-08 | Disposition: A | Discharge status: Home / Self Care | Payer: Medicare Other | Source: Ambulatory Visit | Attending: Internal Medicine | Admitting: Internal Medicine

## 2010-10-08 DIAGNOSIS — R0602 Shortness of breath: Secondary | ICD-10-CM | POA: Insufficient documentation

## 2010-10-14 ENCOUNTER — Encounter: Payer: Self-pay | Admitting: Cardiology

## 2010-10-20 ENCOUNTER — Ambulatory Visit: Payer: Medicare Other | Admitting: Cardiology

## 2010-10-21 ENCOUNTER — Ambulatory Visit (INDEPENDENT_AMBULATORY_CARE_PROVIDER_SITE_OTHER): Payer: Medicare Other | Admitting: Cardiology

## 2010-10-21 ENCOUNTER — Encounter: Payer: Self-pay | Admitting: Cardiology

## 2010-10-21 DIAGNOSIS — I2510 Atherosclerotic heart disease of native coronary artery without angina pectoris: Secondary | ICD-10-CM

## 2010-10-21 DIAGNOSIS — I359 Nonrheumatic aortic valve disorder, unspecified: Secondary | ICD-10-CM

## 2010-10-21 DIAGNOSIS — R5383 Other fatigue: Secondary | ICD-10-CM

## 2010-10-21 DIAGNOSIS — R5381 Other malaise: Secondary | ICD-10-CM

## 2010-10-21 DIAGNOSIS — I351 Nonrheumatic aortic (valve) insufficiency: Secondary | ICD-10-CM

## 2010-10-21 DIAGNOSIS — I10 Essential (primary) hypertension: Secondary | ICD-10-CM

## 2010-10-21 NOTE — Progress Notes (Signed)
Subjective:   Oral and is seen today for three-week followup visit. He has been off of amiodarone and has not had recurrent arrhythmia and in general is beginning to feel better. He still is fatigued and has some weakness in his legs his wife notes that his color is better. He is not having fever or chills or sweats. He does have persistence of his aortic insufficiency murmur but none of the studies have resulted in the diagnosis of SBE. In general, he is gradually getting better. He does have a history of anemia and remains on Pradaxa.  Current Outpatient Prescriptions  Medication Sig Dispense Refill  . ALPRAZolam (XANAX) 0.5 MG tablet Take 0.5 mg by mouth at bedtime.        Marland Kitchen amiodarone (PACERONE) 200 MG tablet Take 200 mg by mouth daily.        Marland Kitchen aspirin 325 MG tablet Take 81 mg by mouth daily.       . bisacodyl (DULCOLAX) 5 MG EC tablet Take 5 mg by mouth daily as needed.        . dabigatran (PRADAXA) 150 MG CAPS Take 150 mg by mouth every 12 (twelve) hours.        Marland Kitchen diltiazem (DILACOR XR) 240 MG 24 hr capsule Take 240 mg by mouth 2 (two) times daily.        . hydrochlorothiazide 25 MG tablet Take 25 mg by mouth daily.        . IRON PO Take 1 tablet by mouth daily.        Marland Kitchen lisinopril (PRINIVIL,ZESTRIL) 20 MG tablet Take 10 mg by mouth 2 (two) times daily. 2 in the am and 2 in the pm      . metFORMIN (GLUCOPHAGE) 1000 MG tablet Take 500 mg by mouth 2 (two) times daily. 2 in the am and 2 in the pm      . metoprolol (TOPROL-XL) 50 MG 24 hr tablet Take 50 mg by mouth daily.        . nitroGLYCERIN (NITROSTAT) 0.4 MG SL tablet Place 0.4 mg under the tongue every 5 (five) minutes as needed.        . pioglitazone (ACTOS) 45 MG tablet Take 45 mg by mouth daily.        . simvastatin (ZOCOR) 20 MG tablet Take 20 mg by mouth at bedtime.          Allergies  Allergen Reactions  . Ibuprofen Hives    Patient Active Problem List  Diagnoses  . Pacemaker  . HTN (hypertension)  . Carotid artery  disease  . Coronary artery disease  . Myocardial infarction  . Tachy-brady syndrome  . Atrial fibrillation  . Carotid stenosis  . Anemia    History  Smoking status  . Former Smoker  . Types: Cigarettes  . Quit date: 09/05/1980  Smokeless tobacco  . Never Used    History  Alcohol Use No    Family History  Problem Relation Age of Onset  . Stroke Father   . Bone cancer Mother   . Coronary artery disease Child   . Coronary artery disease Child     Review of Systems:   The patient denies any heat or cold intolerance.  No weight gain or weight loss.  The patient denies headaches or blurry vision.  There is no cough or sputum production.  The patient denies dizziness.  There is no hematuria or hematochezia.  The patient denies any muscle aches or arthritis.  The patient denies any rash.  The patient denies frequent falling or instability.  There is no history of depression or anxiety.  All other systems were reviewed and are negative.   Physical Exam:   Weight is 175. Blood pressure is 180/70 sitting, heart rate is 60 and regular.The head is normocephalic and atraumatic.  Pupils are equally round and reactive to light.  Sclerae nonicteric.  Conjunctiva is clear.  Oropharynx is unremarkable.  There's adequate oral airway.  Neck is supple there are no masses.  Thyroid is not enlarged.  There is no lymphadenopathy.  Lungs are clear.  Chest is symmetric.pacemaker is in the right upper chest appears satisfactory  Heart shows a regular rate and rhythm.  S1 and S2 are normal.  There is a prominent diastolic murmur consistent with aortic insufficiency.  Abdomen is soft normal bowel sounds.  There is no organomegaly.  Genital and rectal deferred.  Extremities are without edema.  Peripheral pulses are adequate.  Neurologically intact.  Full range of motion.  The patient is not depressed.  Skin is warm and dry.  Assessment / Plan:

## 2010-10-22 DIAGNOSIS — I351 Nonrheumatic aortic (valve) insufficiency: Secondary | ICD-10-CM | POA: Insufficient documentation

## 2010-10-22 DIAGNOSIS — R5383 Other fatigue: Secondary | ICD-10-CM | POA: Insufficient documentation

## 2010-10-22 NOTE — Assessment & Plan Note (Signed)
He is not having angina. He did not have a significant anginal component with his previous cardiac events. He does have remote bypass surgery his last stress test was several years ago. He had cardiac catheterization in 2006.

## 2010-10-22 NOTE — Assessment & Plan Note (Signed)
Blood pressure is elevated today several readings at home have been satisfactory. We'll allow him to resume amlodipine

## 2010-10-26 ENCOUNTER — Inpatient Hospital Stay (HOSPITAL_COMMUNITY)
Admission: AD | Admit: 2010-10-26 | Discharge: 2010-10-28 | DRG: 292 | Disposition: A | Discharge status: Home / Self Care | Payer: Medicare Other | Source: Ambulatory Visit | Attending: Internal Medicine | Admitting: Internal Medicine

## 2010-10-26 ENCOUNTER — Inpatient Hospital Stay (HOSPITAL_COMMUNITY): Payer: Medicare Other

## 2010-10-26 DIAGNOSIS — I4891 Unspecified atrial fibrillation: Secondary | ICD-10-CM | POA: Diagnosis present

## 2010-10-26 DIAGNOSIS — F411 Generalized anxiety disorder: Secondary | ICD-10-CM | POA: Diagnosis present

## 2010-10-26 DIAGNOSIS — E785 Hyperlipidemia, unspecified: Secondary | ICD-10-CM | POA: Diagnosis present

## 2010-10-26 DIAGNOSIS — I509 Heart failure, unspecified: Secondary | ICD-10-CM | POA: Diagnosis present

## 2010-10-26 DIAGNOSIS — Z951 Presence of aortocoronary bypass graft: Secondary | ICD-10-CM

## 2010-10-26 DIAGNOSIS — H409 Unspecified glaucoma: Secondary | ICD-10-CM | POA: Diagnosis present

## 2010-10-26 DIAGNOSIS — Z79899 Other long term (current) drug therapy: Secondary | ICD-10-CM

## 2010-10-26 DIAGNOSIS — I252 Old myocardial infarction: Secondary | ICD-10-CM

## 2010-10-26 DIAGNOSIS — E1142 Type 2 diabetes mellitus with diabetic polyneuropathy: Secondary | ICD-10-CM | POA: Diagnosis present

## 2010-10-26 DIAGNOSIS — R809 Proteinuria, unspecified: Secondary | ICD-10-CM | POA: Diagnosis present

## 2010-10-26 DIAGNOSIS — R627 Adult failure to thrive: Secondary | ICD-10-CM | POA: Diagnosis present

## 2010-10-26 DIAGNOSIS — I129 Hypertensive chronic kidney disease with stage 1 through stage 4 chronic kidney disease, or unspecified chronic kidney disease: Secondary | ICD-10-CM | POA: Diagnosis present

## 2010-10-26 DIAGNOSIS — I2510 Atherosclerotic heart disease of native coronary artery without angina pectoris: Secondary | ICD-10-CM | POA: Diagnosis present

## 2010-10-26 DIAGNOSIS — G4700 Insomnia, unspecified: Secondary | ICD-10-CM | POA: Diagnosis present

## 2010-10-26 DIAGNOSIS — I5033 Acute on chronic diastolic (congestive) heart failure: Principal | ICD-10-CM | POA: Diagnosis present

## 2010-10-26 DIAGNOSIS — R1310 Dysphagia, unspecified: Secondary | ICD-10-CM | POA: Diagnosis present

## 2010-10-26 DIAGNOSIS — I739 Peripheral vascular disease, unspecified: Secondary | ICD-10-CM | POA: Diagnosis present

## 2010-10-26 DIAGNOSIS — Z7982 Long term (current) use of aspirin: Secondary | ICD-10-CM

## 2010-10-26 DIAGNOSIS — I6529 Occlusion and stenosis of unspecified carotid artery: Secondary | ICD-10-CM | POA: Diagnosis present

## 2010-10-26 DIAGNOSIS — I080 Rheumatic disorders of both mitral and aortic valves: Secondary | ICD-10-CM | POA: Diagnosis present

## 2010-10-26 DIAGNOSIS — E538 Deficiency of other specified B group vitamins: Secondary | ICD-10-CM | POA: Diagnosis present

## 2010-10-26 DIAGNOSIS — E1149 Type 2 diabetes mellitus with other diabetic neurological complication: Secondary | ICD-10-CM | POA: Diagnosis present

## 2010-10-26 DIAGNOSIS — N184 Chronic kidney disease, stage 4 (severe): Secondary | ICD-10-CM | POA: Diagnosis present

## 2010-10-26 DIAGNOSIS — Z950 Presence of cardiac pacemaker: Secondary | ICD-10-CM

## 2010-10-26 DIAGNOSIS — D509 Iron deficiency anemia, unspecified: Secondary | ICD-10-CM | POA: Diagnosis present

## 2010-10-26 LAB — CARDIAC PANEL(CRET KIN+CKTOT+MB+TROPI)
CK, MB: 1.7 ng/mL (ref 0.3–4.0)
Relative Index: INVALID (ref 0.0–2.5)
Total CK: 71 U/L (ref 7–232)
Troponin I: <0.3 ng/mL (ref <0.30)

## 2010-10-26 LAB — GLUCOSE, CAPILLARY: Glucose-Capillary: 167 mg/dL — ABNORMAL HIGH (ref 70–99)

## 2010-10-26 LAB — IRON AND TIBC
Saturation Ratios: 6 % — ABNORMAL LOW (ref 20–55)
TIBC: 393 ug/dL (ref 215–435)
UIBC: 368 ug/dL

## 2010-10-27 ENCOUNTER — Inpatient Hospital Stay (HOSPITAL_COMMUNITY): Payer: Medicare Other

## 2010-10-27 DIAGNOSIS — I5031 Acute diastolic (congestive) heart failure: Secondary | ICD-10-CM

## 2010-10-27 LAB — FERRITIN: Ferritin: 21 ng/mL — ABNORMAL LOW (ref 22–322)

## 2010-10-27 LAB — GLUCOSE, CAPILLARY: Glucose-Capillary: 91 mg/dL (ref 70–99)

## 2010-10-27 LAB — IRON AND TIBC: Saturation Ratios: 19 % — ABNORMAL LOW (ref 20–55)

## 2010-10-27 LAB — FOLATE: Folate: >20 ng/mL

## 2010-10-27 LAB — COMPREHENSIVE METABOLIC PANEL
ALT: 9 U/L (ref 0–53)
AST: 15 U/L (ref 0–37)
Albumin: 3.2 g/dL — ABNORMAL LOW (ref 3.5–5.2)
CO2: 27 mEq/L (ref 19–32)
Chloride: 101 mEq/L (ref 96–112)
GFR calc Af Amer: 39 mL/min — ABNORMAL LOW (ref >60)
GFR calc non Af Amer: 33 mL/min — ABNORMAL LOW (ref >60)
Sodium: 139 mEq/L (ref 135–145)
Total Bilirubin: 0.3 mg/dL (ref 0.3–1.2)

## 2010-10-27 LAB — CBC
HCT: 27.7 % — ABNORMAL LOW (ref 39.0–52.0)
Hemoglobin: 8.7 g/dL — ABNORMAL LOW (ref 13.0–17.0)
MCH: 25.7 pg — ABNORMAL LOW (ref 26.0–34.0)
MCHC: 31.4 g/dL (ref 30.0–36.0)
MCV: 82 fL (ref 78.0–100.0)

## 2010-10-27 LAB — RETICULOCYTES
RBC.: 3.64 MIL/uL — ABNORMAL LOW (ref 4.22–5.81)
Retic Ct Pct: 1.4 % (ref 0.4–3.1)

## 2010-10-27 LAB — VITAMIN B12: Vitamin B-12: 328 pg/mL (ref 211–911)

## 2010-10-28 LAB — COMPREHENSIVE METABOLIC PANEL
Albumin: 3.3 g/dL — ABNORMAL LOW (ref 3.5–5.2)
BUN: 36 mg/dL — ABNORMAL HIGH (ref 6–23)
Calcium: 9.1 mg/dL (ref 8.4–10.5)
Creatinine, Ser: 2.22 mg/dL — ABNORMAL HIGH (ref 0.4–1.5)
Glucose, Bld: 142 mg/dL — ABNORMAL HIGH (ref 70–99)
Potassium: 3.8 mEq/L (ref 3.5–5.1)
Total Protein: 6.3 g/dL (ref 6.0–8.3)

## 2010-10-28 LAB — CBC
HCT: 27.6 % — ABNORMAL LOW (ref 39.0–52.0)
MCH: 25.9 pg — ABNORMAL LOW (ref 26.0–34.0)
MCHC: 31.5 g/dL (ref 30.0–36.0)
MCV: 82.1 fL (ref 78.0–100.0)
Platelets: 384 10*3/uL (ref 150–400)
RDW: 15 % (ref 11.5–15.5)
WBC: 7.8 10*3/uL (ref 4.0–10.5)

## 2010-10-28 LAB — GLUCOSE, CAPILLARY
Glucose-Capillary: 145 mg/dL — ABNORMAL HIGH (ref 70–99)
Glucose-Capillary: 274 mg/dL — ABNORMAL HIGH (ref 70–99)

## 2010-10-28 LAB — PRO B NATRIURETIC PEPTIDE: Pro B Natriuretic peptide (BNP): 2299 pg/mL — ABNORMAL HIGH (ref 0–450)

## 2010-10-28 NOTE — Consult Note (Signed)
Steven Callahan, VANHORN NO.:  [id number]  MEDICAL RECORD NO.:  [id number]           PATIENT TYPE:  LOCATION:                                 FACILITY:  PHYSICIAN:  Veverly Fells. Excell Seltzer, MD  DATE OF BIRTH:  [birth year]  DATE OF CONSULTATION: DATE OF DISCHARGE:                                CONSULTATION   REASON FOR CONSULTATION:  Congestive heart failure.  HISTORY OF PRESENT ILLNESS:  Mr. Steven Callahan is a very nice 77 year old gentleman with a complex cardiac history.  He has coronary artery disease with remote coronary artery bypass surgery.  He also has paroxysmal atrial fibrillation and require permanent pacemaker placement in January 2012 for tachybrady syndrome.  The patient reports that since he was discharged from the hospital in January he has noticed slowly progressive symptoms of weakness and fatigue.  Over more recent weeks, he has become increasingly dyspneic and also has developed orthopnea and PND.  He has had to sit up in bed on several different occasions in order to breathe more easily.  The patient's wife feels that his abdomen is distended as well.  He was seen by Dr. Timothy Lasso this morning and with all of these symptoms as well as clinical signs of congestive heart failure he was admitted for further management.  The patient also notes increased episodes of chest tightness.  These have occurred at rest and have generally been in the early morning hours.  He has taken sublingual nitroglycerin with some relief of his chest tightness.  He denies lightheadedness, palpitations, or syncope. He has no other complaints at this time.  PAST MEDICAL HISTORY: 1. Coronary artery disease, status post coronary artery bypass surgery     in 1999.  The patient had his first myocardial infarction in 1981.     He was grafted with LIMA to LAD, saphenous vein graft to OM, and     saphenous vein graft to PDA. 2. History of paroxysmal atrial fibrillation. 3. Status post  permanent pacemaker for posttermination pauses with     atrial fib. 4. Carotid stenosis with total occlusion of the left carotid artery,     followed by [facility]. 5. Anemia. 6. Type 2 diabetes. 7. Hypertension.  PAST SURGICAL HISTORY:  Multivessel CABG, surgery for a deviated nasal septum, and bilateral cataract surgery.  SOCIAL HISTORY:  The patient is married, with 2 children and multiple grandchildren and great-grandchildren.  He worked as a Firefighter.  The patient is a former smoker, but quit at age 20.  He drinks alcohol on very rare occasions.  FAMILY HISTORY:  The patient's father died at 54 of a stroke.  Mother died at 42 of breast cancer.  REVIEW OF SYSTEMS:  Negative except as per HPI.  PHYSICAL EXAMINATION:  GENERAL:  The patient is an elderly male.  He is very pleasant.  He is in no acute distress on oxygen by nasal cannula. VITAL SIGNS:  His initial oxygen saturation was 90% on room air.  Heart rate 60, respiratory rate is 25, blood pressure is 184/56, and temperature is 97.5. HEENT:  Normal. NECK:  Normal carotid upstrokes with bilateral bruits.  Jugular venous pressure is elevated to the mandible.  There is no thyromegaly or thyroid nodules. LUNGS:  Inspiratory rales bilaterally in the bases, left greater than right.  The upper lobes are clear. CARDIOVASCULAR:  The apex is discreet and nondisplaced. HEART:  Regular rate and rhythm with grade 3/6 systolic murmur at the left sternal border.  There is no diastolic murmur appreciated. ABDOMEN:  Soft and distended.  The abdomen is nontender with positive bowel sounds. EXTREMITIES:  There is 1+ bilateral ankle edema.  The feet are warm and appear well perfused.  Distal pulses are intact. SKIN:  There is no rash. BACK:  No CVA tenderness. NEUROLOGIC:  Grossly intact with 5/5 strength in the arms and legs.  EKG shows normal sinus rhythm with a nonspecific IVCD.  LABORATORY VALUES:  BNP of 3877.   Troponin less than 0.3.  CK-MB of 71 and 1.7.  Chest x-ray shows cardiomegaly with pulmonary vascular congestion and bilateral pleural effusions.  The remaining lab work is currently pending.  Two-D echocardiogram was reviewed from August 10, 2010.  This demonstrated LVH with focal basal hypertrophy.  The left ventricular ejection fraction was 60-65%.  The aortic valve was severely calcified with moderate aortic regurgitation.  This was confirmed by TEE.  There was also moderate mitral regurgitation on TEE.  FINAL ASSESSMENT: 1. Acute on chronic diastolic heart failure. 2. Paroxysmal atrial fibrillation, status post permanent pacemaker     placement. 3. Coronary artery disease, status post coronary artery bypass     surgery. 4. Hypertension. 5. Moderate valvular heart disease with aortic insufficiency and     mitral regurgitation.  DISCUSSION:  The patient has typical symptoms and clear-cut clinical evidence of congestive heart failure.  He is volume overloaded and has a markedly elevated BNP.  IV diuretics have been started by Dr. Timothy Lasso and he has received his first dose of furosemide 80 mg.  The patient's previous renal function was reviewed and his creatinine has been normal. I would expect a good response to IV Lasix.  He otherwise is on a very good medical regimen with an ACE inhibitor, beta-blocker, and calcium channel blocker.  We would continue his other medications without changes at present.  He has been on hydrochlorothiazide at home and I suspect he will benefit from a change to a loop diuretic once he is finished with IV diuresis.  With respect to his atrial fibrillation, he is currently maintaining sinus rhythm.  He is on Pradaxa for anticoagulation.  He apparently has not tolerated amiodarone.  We will continue his beta-blocker and calcium channel blocker.  The patient does report increased angina, but I think this is probably due to acute congestive heart  failure and considering his advanced age I would only investigate this if he has persistent symptoms once his heart failure is treated.  He is on antiplatelet therapy with aspirin.  Thanks for asking [place] to see Mr. Reas.  We will follow along during his hospital stay.     Veverly Fells. Excell Seltzer, MD     MDC/MEDQ  D:  10/26/2010  T:  10/27/2010  Job:  [id number]  cc:   Gwen Pounds, MD Colleen Can Deborah Chalk, M.D.  Electronically Signed by Tonny Bollman MD on 10/28/2010 10:42:51 AM

## 2010-11-04 ENCOUNTER — Ambulatory Visit (INDEPENDENT_AMBULATORY_CARE_PROVIDER_SITE_OTHER): Payer: Medicare Other | Admitting: Internal Medicine

## 2010-11-04 ENCOUNTER — Encounter: Payer: Self-pay | Admitting: Internal Medicine

## 2010-11-04 DIAGNOSIS — I495 Sick sinus syndrome: Secondary | ICD-10-CM

## 2010-11-04 DIAGNOSIS — I779 Disorder of arteries and arterioles, unspecified: Secondary | ICD-10-CM

## 2010-11-04 DIAGNOSIS — D649 Anemia, unspecified: Secondary | ICD-10-CM

## 2010-11-04 DIAGNOSIS — I4891 Unspecified atrial fibrillation: Secondary | ICD-10-CM

## 2010-11-04 DIAGNOSIS — I10 Essential (primary) hypertension: Secondary | ICD-10-CM

## 2010-11-04 NOTE — Assessment & Plan Note (Signed)
Normal pacemaker function His histograms are flat.  I have therefore turned rate response on today to promote increased exercise tolerance and hopefully reduce fatigue and weakness. See Arita Miss Art report

## 2010-11-04 NOTE — Progress Notes (Signed)
The patient presents today for routine electrophysiology followup.  He presented in January to [facility] with atrial fibrillation with prolonged pauses.  He underwent pacemaker implantation by Dr Deborah Chalk at that time.  He has been off of amiodarone and has not had recurrent arrhythmia and in general is beginning to feel better. He still is fatigued and has some weakness.   He takes pradaxa for stroke prevention and denies bleeding. Today, he denies symptoms of palpitations, chest pain, shortness of breath, orthopnea, PND,  presyncope, syncope, or neurologic sequela.  The patient feels that he is tolerating medications without difficulties and is otherwise without complaint today.   Past Medical History  Diagnosis Date  . Murmur   . 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 HTN (hypertension)   . Carotid artery disease     TOTALLY OCCLUDED LEFT CAROTID  . Coronary artery disease   . Myocardial infarction 1981  . Atrial fibrillation   . GERD (gastroesophageal reflux disease)   . Carotid stenosis   . Anemia    Past Surgical History  Procedure Date  . Nasal septum surgery   . Cataract extraction, bilateral   . Coronary artery bypass graft 1999  . Pacemaker insertion 07/08/10    MDT pacemaker by Dr Deborah Chalk for tachy/brady syndrome    Current Outpatient Prescriptions  Medication Sig Dispense Refill  . ALPRAZolam (XANAX) 0.5 MG tablet Take 0.5 mg by mouth at bedtime.        Marland Kitchen aspirin 81 MG tablet Take 81 mg by mouth daily.        . bisacodyl (DULCOLAX) 5 MG EC tablet Take 5 mg by mouth daily as needed.        . dabigatran (PRADAXA) 150 MG CAPS Take 75 mg by mouth every 12 (twelve) hours.       . furosemide (LASIX) 20 MG tablet Take 20 mg by mouth 2 (two) times daily.        . IRON PO Take 1 tablet by mouth daily.        . isosorbide mononitrate (IMDUR) 60 MG 24 hr tablet Take 60 mg by mouth daily.        . Linagliptin (TRADJENTA) 5 MG TABS Take 5 mg by mouth  daily.        Marland Kitchen lisinopril (PRINIVIL,ZESTRIL) 20 MG tablet Take 40 mg by mouth 2 (two) times daily.       . metoprolol (TOPROL-XL) 50 MG 24 hr tablet Take 50 mg by mouth daily.        . nitroGLYCERIN (NITROSTAT) 0.4 MG SL tablet Place 0.4 mg under the tongue every 5 (five) minutes as needed.        Marland Kitchen DISCONTD: amiodarone (PACERONE) 200 MG tablet Take 200 mg by mouth daily.        Marland Kitchen DISCONTD: aspirin 325 MG tablet Take 81 mg by mouth daily.       Marland Kitchen DISCONTD: diltiazem (DILACOR XR) 240 MG 24 hr capsule Take 240 mg by mouth 2 (two) times daily.        Marland Kitchen DISCONTD: hydrochlorothiazide 25 MG tablet Take 25 mg by mouth daily.        Marland Kitchen DISCONTD: metFORMIN (GLUCOPHAGE) 1000 MG tablet Take 500 mg by mouth 2 (two) times daily. 2 in the am and 2 in the pm      . DISCONTD: pioglitazone (ACTOS) 45 MG tablet Take 45 mg by mouth daily.        Marland Kitchen  DISCONTD: simvastatin (ZOCOR) 20 MG tablet Take 20 mg by mouth at bedtime.          Allergies  Allergen Reactions  . Ibuprofen Hives    History   Social History  . Marital Status: Married    Spouse Name: N/A    Number of Children: N/A  . Years of Education: N/A   Occupational History  . Not on file.   Social History Main Topics  . Smoking status: Former Smoker    Types: Cigarettes    Quit date: 09/05/1980  . Smokeless tobacco: Never Used  . Alcohol Use: No  . Drug Use: No  . Sexually Active: Not on file   Other Topics Concern  . Not on file   Social History Narrative  . No narrative on file    Family History  Problem Relation Age of Onset  . Stroke Father   . Bone cancer Mother   . Coronary artery disease Child   . Coronary artery disease Child     Physical Exam: Filed Vitals:   11/04/10 1554 11/04/10 1555 11/04/10 1556 11/04/10 1558  BP: 112/43 111/41 125/44 124/43  Pulse: 60 60 60 59  Resp:    18  Height:    6' (1.829 m)  Weight:    168 lb 6.4 oz (76.386 kg)    GEN- The patient is elderly appearing, alert and oriented x 3  today.   Head- normocephalic, atraumatic Eyes-  Sclera clear, conjunctiva pink Ears- hearing intact Oropharynx- clear Neck- supple, no JVP Lymph- no cervical lymphadenopathy Lungs- Clear to ausculation bilaterally, normal work of breathing Chest- pacemaker pocket is well healed Heart- Regular rate and rhythm, no murmurs, rubs or gallops, PMI not laterally displaced GI- soft, NT, ND, + BS Extremities- no clubbing, cyanosis, 1+ edema MS- age appropriate muscle atrophy Skin- no rash or lesion Psych- euthymic mood, full affect  Pacemaker interrogation- reviewed in detail today,  See PACEART report  Assessment and Plan:

## 2010-11-04 NOTE — Assessment & Plan Note (Signed)
I have reviewed recent labs from Dr Perini's office which reveal stable hemaglobin on pradaxa.

## 2010-11-04 NOTE — Assessment & Plan Note (Signed)
Stable No change required today   He has planned follow-up with Dr Deborah Chalk

## 2010-11-04 NOTE — Assessment & Plan Note (Signed)
Stable No change required today  

## 2010-11-04 NOTE — Patient Instructions (Signed)
Your physician wants you to follow-up in: Jan 2013 with Dr Lona Millard will receive a reminder letter in the mail two months in advance. If you don't receive a letter, please call our office to schedule the follow-up appointment.

## 2010-11-09 NOTE — Procedures (Signed)
CAROTID DUPLEX EXAM   INDICATION:  Follow-up known carotid artery disease.   HISTORY:  Diabetes:  Yes.  Cardiac:  MI in 1991 and CABG in 1999.  Hypertension:  Yes.  Smoking:  No.  Previous Surgery:  None.  CV History:  Amaurosis Fugax No, Paresthesias No, Hemiparesis No.                                       RIGHT             LEFT  Brachial systolic pressure:         210               210  Brachial Doppler waveforms:         Biphasic          Biphasic  Vertebral direction of flow:        Antegrade         Antegrade  DUPLEX VELOCITIES (cm/sec)  CCA peak systolic                   196               81  ECA peak systolic                   187               84  ICA peak systolic                   276               Occluded  ICA end diastolic                   53                Occluded  PLAQUE MORPHOLOGY:                  Heterogeneous     Heterogeneous  PLAQUE AMOUNT:                      Moderate to severe                  Severe  PLAQUE LOCATION:                    ICA and ECA       ICA and ECA   IMPRESSION:  1. Occluded left ICA.  2. A 60-79% stenosis noted in the right ICA.  3. Antegrade bilateral vertebral arteries.   ___________________________________________  Di Kindle. Edilia Bo, M.D.   MG/MEDQ  D:  06/03/2008  T:  06/03/2008  Job:  [id number]

## 2010-11-09 NOTE — Procedures (Signed)
CAROTID DUPLEX EXAM   INDICATION:  Followup, carotid artery disease.   HISTORY:  Diabetes:  Orally controlled.  Cardiac:  MI in 1991, CABG in 1999.  Hypertension:  Yes.  Smoking:  No.  Previous Surgery:  No.  CV History:  No.  Amaurosis Fugax No, Paresthesias No, Hemiparesis No.                                       RIGHT             LEFT  Brachial systolic pressure:         180               182  Brachial Doppler waveforms:         Normal            Normal  Vertebral direction of flow:        Antegrade         Antegrade  DUPLEX VELOCITIES (cm/sec)  CCA peak systolic                   135               76  ECA peak systolic                   178               61  ICA peak systolic                   209               Occluded  ICA end diastolic                   53  PLAQUE MORPHOLOGY:                  Heterogenous      Heterogenous  PLAQUE AMOUNT:                      Moderate          Severe  PLAQUE LOCATION:                    ICA/ECA/CCA       ICA/CCA   IMPRESSION:  1. 60-79% stenosis of the right internal carotid artery.  2. Known occlusion of left internal carotid artery.  3. No significant change noted from previous examination on 06/26/07.   ___________________________________________  Di Kindle. Edilia Bo, M.D.   CH/MEDQ  D:  12/04/2007  T:  12/04/2007  Job:  [id number]

## 2010-11-09 NOTE — Procedures (Signed)
CAROTID DUPLEX EXAM   INDICATION:  Followup of known carotid artery disease.   HISTORY:  Diabetes:  Yes.  Cardiac:  MI in 1991 and CABG in 1999.  Hypertension:  Yes.  Smoking:  Quit.  Previous Surgery:  No.  CV History:  No.  Amaurosis Fugax No, Paresthesias No, Hemiparesis No.                                       RIGHT             LEFT  Brachial systolic pressure:         170               182  Brachial Doppler waveforms:         WNL               WNL  Vertebral direction of flow:        Antegrade         Antegrade  DUPLEX VELOCITIES (cm/sec)  CCA peak systolic                   230               100  ECA peak systolic                   164               87  ICA peak systolic                   262               Occluded  ICA end diastolic                   68                Occluded  PLAQUE MORPHOLOGY:                  Calcified         Calcified  PLAQUE AMOUNT:                      Moderate          Severe  PLAQUE LOCATION:                    CCA, ICA, ECA     BIF, ICA   IMPRESSION:  1. 60-79% right internal carotid artery stenosis.  2. Known occluded left internal carotid artery stenosis.        ___________________________________________  Di Kindle. Edilia Bo, M.D.   AC/MEDQ  D:  12/23/2008  T:  12/23/2008  Job:  [id number]

## 2010-11-09 NOTE — Procedures (Signed)
CAROTID DUPLEX EXAM   INDICATION:  Followup evaluation of carotid artery disease.   HISTORY:  Diabetes:  Yes  Cardiac:  MI in 1991 and CABG in 1999.  Hypertension:  Yes  Smoking:  Quit  Previous Surgery:  No  CV History:  No  Amaurosis Fugax No, Paresthesias No, Hemiparesis No                                       RIGHT             LEFT  Brachial systolic pressure:         162               168  Brachial Doppler waveforms:         WNL               WNL  Vertebral direction of flow:        antegrade         antegrade  DUPLEX VELOCITIES (cm/sec)  CCA peak systolic                   122               97  ECA peak systolic                   208               63  ICA peak systolic                   212               occluded  ICA end diastolic                   36                occluded  PLAQUE MORPHOLOGY:                  calcific          heterogeneous  PLAQUE AMOUNT:                      moderate          severe  PLAQUE LOCATION:                    ICA, ECA          ICA   IMPRESSION:  1. A 60% to 79% stenosis of the right internal carotid artery.  2. Occluded left internal carotid artery noted.  3. Right external carotid artery stenosis.  4. Antegrade flow in bilateral vertebrals.   ___________________________________________  Di Kindle. Edilia Bo, M.D.   CB/MEDQ  D:  06/08/2009  T:  06/09/2009  Job:  [id number]

## 2010-11-09 NOTE — Assessment & Plan Note (Signed)
OFFICE VISIT   Steven Callahan, Steven Callahan  DOB:  1921/04/06                                       12/13/2006  ZOXWR#:[id number]   I saw Steven Callahan in the office today for continued followup of his  moderate carotid disease.  Since I saw him last in December 2007 he  denies any history of stroke, TIAs, expressive or receptive aphasia, or  amaurosis fugax.   REVIEW OF SYSTEMS:  He has had no chest pain, chest pressure,  palpitations or arrhythmias.  He has had no bronchitis, asthma or  wheezing.   PHYSICAL EXAMINATION:  Blood pressure is 129/61, heart rate is 62.  He  has a soft right carotid bruit.  Lungs are clear bilaterally to  auscultation.  On cardiac exam, he has a regular rate and rhythm.  Neurologic exam is nonfocal.   Carotid duplex scan shows an occluded left internal carotid artery which  is known.  On the right side he has a 60-79% stenosis.  These velocities  are stable.  Both vertebral arteries are patent with normally directed  flow.   He understands we would not consider right carotid endarterectomy unless  the stenosis progressed to greater than 80% or he developed new  neurologic symptoms.  I will see him back  in 6 months for a followup duplex scan.  He knows to call sooner if he  has problems.  In the meantime, he knows to continue taking his aspirin.   Di Kindle. Edilia Bo, M.D.  Electronically Signed   CSD/MEDQ  D:  12/13/2006  T:  12/14/2006  Job:  [id number]   cc:   Wilson Singer, M.D.

## 2010-11-09 NOTE — Procedures (Signed)
CAROTID DUPLEX EXAM   INDICATION:  Followup, carotid artery disease.   HISTORY:  Diabetes:  Orally controlled.  Cardiac:  MI in 1991, CABG in 1999.  Hypertension:  Yes.  Smoking:  No.  Previous Surgery:  No.  CV History:  No.  Amaurosis Fugax No, Paresthesias No, Hemiparesis No                                       RIGHT             LEFT  Brachial systolic pressure:         188               185  Brachial Doppler waveforms:         Triphasic         Triphasic  Vertebral direction of flow:        Antegrade         Antegrade  DUPLEX VELOCITIES (cm/sec)  CCA peak systolic                   107               94  ECA peak systolic                   194               68  ICA peak systolic                   240               Occluded  ICA end diastolic                   60                Occluded  PLAQUE MORPHOLOGY:                  Calcified         Mixed  PLAQUE AMOUNT:                      Moderate          Severe  PLAQUE LOCATION:                    ICA/ECA/CCA       ICA   IMPRESSION:  1. Right internal carotid artery shows evidence of 60-79% stenosis.  2. Right external carotid artery stenosis.  3. Left known internal carotid artery occlusion.  4. No significant changes from previous study in regards to carotids.  5. New incidental findings:  Right thyroid shows evidence of irregular-      shaped nodule measuring 0.70 cm X 0.73 cm in diameter.  6. Left thyroid shows evidence of cyst-like structure measuring 2.36      cm X 2.35 cm in diameter.   ___________________________________________  Di Kindle. Edilia Bo, M.D.   AS/MEDQ  D:  06/26/2007  T:  06/26/2007  Job:  [id number]   cc:   Gwen Pounds, MD

## 2010-11-09 NOTE — Procedures (Signed)
CAROTID DUPLEX EXAM   INDICATION:  Followup carotid artery disease.   HISTORY:  Diabetes:  Yes  Cardiac:  MI in 1991, CABG in 1999  Hypertension:  Yes  Smoking:  Quit  Previous Surgery:  No  CV History:  No  Amaurosis Fugax No, Paresthesias No, Hemiparesis No                                       RIGHT             LEFT  Brachial systolic pressure:         172               168  Brachial Doppler waveforms:         WNL               WNL  Vertebral direction of flow:        Antegrade         Antegrade  DUPLEX VELOCITIES (cm/sec)  CCA peak systolic                   186               131  ECA peak systolic                   223               104  ICA peak systolic                   233               Occluded  ICA end diastolic                   47                Occluded  PLAQUE MORPHOLOGY:                  Heterogenous      Heterogenous  PLAQUE AMOUNT:                      Moderate to severe                  Severe  PLAQUE LOCATION:                    ICA/ECA           ICA   IMPRESSION:  1. Right internal carotid artery suggests 60% to 79% stenosis.  2. Left internal carotid artery occlusion.  3. Bilateral vertebrals appear antegrade.   ___________________________________________  Di Kindle. Edilia Bo, M.D.   CB/MEDQ  D:  12/17/2009  T:  12/17/2009  Job:  [id number]

## 2010-11-12 NOTE — H&P (Signed)
Steven Callahan, Steven Callahan                ACCOUNT NO.:  [id number]   MEDICAL RECORD NO.:  [id number]          PATIENT TYPE:  OIB   LOCATION:                               FACILITY:  [facility]   PHYSICIAN:  Colleen Can. Deborah Chalk, M.D.DATE OF BIRTH:  02/07/1921   DATE OF ADMISSION:  08/09/2004  DATE OF DISCHARGE:                                HISTORY & PHYSICAL   CHIEF COMPLAINT:  Previous chest pain in the setting of progressive weakness  and fatigue.   HISTORY OF PRESENT ILLNESS:  Mr. Godown is a very pleasant 77 year old white  male who has multiple medical problems.  He had a past history of bypass  surgery that dates back to 1999.  He has had followup Cardiolite study in  March 2005 which was satisfactory.  He was seen as a work-in appointment on  January 27 primarily at the request of his wife and daughter.  He was  complaining of sinking spells that occurred primarily at night as well as  at rest.  He was having some chest pain which was similar to his previous  chest pain syndrome.  It was more localized to the right side of the chest  as well as down the right arm.  This is identical to his previous chest pain  syndrome.  He has also had palpitations in which he describes his heart is  skipping.  He has used nitroglycerin over the last several weeks which is  certainly unusual for him as well.  There is some concern for the  possibility of depression.  He notes that he just has a generalized feeling  of weakness of fatigue.  In light of his multiple symptoms, he is referred  now for repeat cardiac catheterization.   PAST MEDICAL HISTORY:  1.  Atherosclerotic cardiovascular disease.  History of remote MI in 1981      with catheterization subsequently in 1999 followed by bypass surgery x 4      with left internal mammary artery to the LAD, saphenous vein graft to      the OM, saphenous vein graft to the PDA, and saphenous vein graft to the      diagonal vessel.  His last Cardiolite study was  in March 2005.  2.  Hypertension with history of ACE intolerance.  3.  Non-insulin-dependent diabetes.  4.  Gastroesophageal reflux disease.  5.  Past history of tobacco abuse.  6.  Hyperlipidemia.  7.  Known occluded left carotid artery with a known 68% occlusion on the      right followed by Dr. Cari Caraway.  8.  Remote history of nose surgery.   ALLERGIES:  ALEVE.   CURRENT MEDICATIONS:  1.  Amaryl twice a day.  2.  Actos daily.  3.  Metformin twice a day.  4.  Zocor 20 mg a day.  5.  Atenolol HCT 50/25 daily.   FAMILY HISTORY:  His parents are deceased.  Father died at 34 and had  history of stroke, questionable heart disease, and hypertension.  Mother  died at age 70 with history of bone  cancer.   SOCIAL HISTORY:  He is married.  He has no current alcohol or tobacco use.   REVIEW OF SYSTEMS:  Basically as noted above and otherwise unremarkable.   PHYSICAL EXAMINATION:  GENERAL:  He is a pleasant, elderly white male who  appears younger than his stated age.  VITAL SIGNS: His weight is 187 pounds.  Blood pressure 130/70 sitting,  140/70 standing, heart rate 62 and regular, respirations 18.  He is  afebrile.  SKIN:  Warm and dry.  Color is unremarkable.  LUNGS: Basically clear.  HEART:  Regular rhythm.  ABDOMEN:  Soft, positive bowel sounds, nontender.  EXTREMITIES:  Without edema.  NEUROLOGIC:  Intact. There are no gross focal deficits.   LABORATORY DATA AND OTHER STUDIES:  Pertinent labs are pending.   OVERALL IMPRESSION:  1.  Chest pain.  2.  Weakness and fatigue.  3.  Diabetes.  4.  Hypertension.  5.  Questionable depression.  6.  Known peripheral vascular disease with a totally occluded left carotid      artery.   PLAN:  In light of his multiple somatic complaints in the setting of known  atherosclerotic cardiovascular disease, will proceed on with repeat cardiac  catheterization.  The procedure has been reviewed in full detail including  the risks and  benefits, and he is willing to proceed on Monday, August 09, 2004.      ________________________________________  Sharlee Blew, N.P.  ___________________________________________  Colleen Can. Deborah Chalk, M.D.    LC/MEDQ  D:  08/02/2004  T:  08/02/2004  Job:  [id number]   cc:   Wilson Singer, M.D.  [address]  [place]  [place] [address]  Fax: [phone]

## 2010-11-12 NOTE — Cardiovascular Report (Signed)
Steven Callahan NO.:  [id number]   MEDICAL RECORD NO.:  [id number]          PATIENT TYPE:  OIB   LOCATION:  2857                         FACILITY:  [facility]   PHYSICIAN:  Colleen Can. Deborah Chalk, M.D.DATE OF BIRTH:  [birth year]   DATE OF PROCEDURE:  08/09/2004  DATE OF DISCHARGE:                              CARDIAC CATHETERIZATION   Steven Callahan presents with a history of coronary artery bypass grafting in 1999  with the last stress test in March of 2005.  He has a history of  hypertension and diabetes.  He also has known cerebrovascular disease with a  left occluded carotid and 68% of his right coronary artery.  He was referred  for a cardiac catheterization because of persistent of fatigue and a vague  atypical chest pain that was somewhat from his previous angina.   PROCEDURES:  Left heart catheterization with selective coronary angiography,  left ventricular angiography, left vein graft angiography x 3 and  angiography of the free left internal mammary artery graft.   TYPE AND SITE OF INJURY:  Percutaneous right femoral artery with Angio-Seal.   CATHETERS:  1.  6 French 4 curve Judkins right and left coronary catheters.  2.  6 French pigtail ventricular graft catheter.   MEDICATIONS GIVEN PRIOR TO PROCEDURE:  Valium 10 mg p.o.   MEDICATIONS GIVEN DURING THE PROCEDURE:  1.  Versed 3 mg IV.  2.  Ancef 1 g IV.   CONTRAST:  Omnipaque.   COMMENTS:  The patient tolerated the procedure well.  Angio-Seal was used.   HEMODYNAMIC DATA:  The aortic pressure was 161/60.  LV was 152/5-15.  There  was no aortic valve gradient noted on pullback.   ANGIOGRAPHIC DATA:  The left ventricular angiogram was performed in the RAO  position.  The overall cardiac size and silhouette were normal.  The aortic  root was prominent.  There was inferobasal hypokinesis in a rather discrete  manner.  The global ejection fraction will be in the 55-60% range.   CORONARY ARTERIES:  The  coronary arteries arise and distributed normally.   1.  The right coronary artery is totally occluded.  2.  The saphenous vein graft to the distal right coronary artery is widely      patent with nice insertion and good distal runoff.  3.  The left circumflex has a totally occluded obtuse marginal.  The obtuse      marginal 2 is widely patent.  4.  The saphenous vein graft to the obtuse marginal is patent with nice      insertion and good distal runoff.  5.  The left main coronary artery has an ostial 50% stenosis.  6.  The left anterior descending has diffuse irregularities proximally.      There was a 95% stenosis before a large septal perforating branch and      there is an area of bifurcation between the diagonal and continuation of      the LAD.  This is a potential area of ischemia in that there does not      appear  to be significant retrograde flow from the distal grafted      diagonal and LAD.  7.  Saphenous vein graft to the diagonal is widely patent with nice      insertion and good distal runoff.  8.  Left internal mammary artery to the LAD as a free graft with nice      insertion and good distal runoff.   OVERALL IMPRESSION:  1.  Normal global left ventricular function with mild inferior basilar      hypokinesia.  2.  Three-vessel coronary artery disease with totally occluded right      coronary artery, left anterior descending and obtuse marginal with      residual stenosis before the large septal perforating branch in the left      anterior descending.   DISCUSSION:  It is felt that we probably can manage [name] medically and  at his point in time that a stent is not needed in his left anterior  descending.  There does appear to be some degree of compromise in the left  anterior descending and should symptoms persist that sound more ischemic or  have objective testing of ischemia, we could try a somewhat complex  angioplasty on his left anterior descending to try to  improve flow to this  septal perforating branch.  Overall, I do not think that it is wise to  proceed in that direction at this point in time.      SNT/MEDQ  D:  08/09/2004  T:  08/09/2004  Job:  [id number]

## 2010-11-23 ENCOUNTER — Encounter (HOSPITAL_COMMUNITY): Payer: Medicare Other

## 2010-11-24 NOTE — H&P (Signed)
[name], [name]:  [id number]  MEDICAL RECORD NO.:  [id number]           PATIENT TYPE:  I  LOCATION:  4735                         FACILITY:  [facility]  PHYSICIAN:  Gwen Pounds, MD       DATE OF BIRTH:  1920/09/19  DATE OF ADMISSION:  10/26/2010 DATE OF DISCHARGE:                             HISTORY & PHYSICAL   PRIMARY CARE PROVIDER:  Gwen Pounds, MD  CARDIOLOGIST:  Colleen Can. Deborah Chalk, MD  CHIEF COMPLAINT:  Worsening shortness of breath, worsening dyspnea, increasing weight, and lower extremity edema.  HISTORY OF PRESENT ILLNESS:  Steven Callahan is an 77 year old male who has been having a significantly bad time since the turn of the new year. He was admitted to the hospital in early January by the Cardiology Service with new onset AFib with rapid ventricular response ended up with significant pauses in and out of sinus rhythm ended up with a pacemaker.  He was discharged on Pradaxa.  He has known renal insufficiency and this was also an issue earlier in the year.  He was also discharged on amiodarone.  He followed up with me on July 15, 2010, and was doing okay.  He was having low blood sugars and we stopped one of his oral hypoglycemic medications.  He was having some orthostasis and this was discussed.  Lab data done at that time showed a really low B12 at 114.  Iron profile was fine.  Lipids were fine.  CMET showed a creatinine of 1.6, potassium 5.6.  Rest of his labs were fine.  Hemoglobin was 11.3 at that day.  It was elected to follow him clinically at that point and see him quickly and start some B12 shots.  The patient then followed up with Cardiology and had labs there as well.  Creatinine 1-1.97.  Because of continued symptoms, multiple discussions were had with Norma Fredrickson and Dr. Roger Shelter about what to do next.  Amiodarone dosing was adjusted, and he eventually went on to have a transesophageal echo and multiple  reprogramming of the pacemaker.  The transesophageal echo did not reveal any thrombus.  His EF was 55% to 65%.  There was left ventricular hypertrophy, but there was mild-to-moderate aortic regurgitation noted and mild-to-moderate mitral regurgitation noted.  The pacemaker was seen.  The patient then did not see me for a little while and came back and the next appointment was on September 22, 2010.  At that visit, the blood pressure was high at 180/56.  He was having significant weakness, failure to thrive, some nausea, increasing dyspnea on exertion, decreased function, no appetite, not sleeping well.  There was question as to whether he was having some amiodarone toxicity and whether he truly still needed the medication or not.  Amlodipine had been discontinued prior and we coordinated with Dr. Ronnald Nian office as to try to give him the best care.  Sed rate was obtained and it was high at 67.  Anemia was stable.  He was continued on the B12.  Stool cards were attempted to be obtained and we tried to  discontinue the metformin based on the elevated creatinine and we eventually went ahead and stopped the amiodarone.  There were no symptoms compatible with temporal arteritis at that time nor is there currently.  Because the sed rate was high, I did an ANA and a rheumatoid factor, both were negative.  I went ahead and did a chest x-ray and it showed potential right lower lobe infiltrate and this was treated.  PFTs were obtained.  The stool cards eventually came back negative x3.  The PFTs showed normal spirometry but a decreased DLCO, and lumbar of x-ray spines were then obtained which revealed findings compatible with ankylosing spondylitis, but he is not very symptomatic if this is a true medical disease for him.  The patient then followed up with me on October 19, 2010.  Blood pressure was still high.  He seemed to be slowly getting better.  We discussed all of his issues, and I was going to  repeat labs and follow him up shortly.  CRP was obtained and was negative.  Iron saturation was low.  HLA-B27 was negative.  Sed rate was repeated at 69, hemoglobin was 9.8, and creatinine was 1.8.  Rest of labs were unremarkable.  The patient called in today and was worked in urgently. His weight was 179 pounds which is 5 pounds up from prior visit.  His blood pressure was 160/54.  His sats were 85% on room air.  He is weak. He is having failure to thrive.  He has had increasing shortness of breath, increasing dyspnea on exertion, increased paroxysmal nocturnal dyspnea, orthopnea, and tachypnea especially with activity.  Chest x-ray was obtained and was compatible with congestive heart failure.  Sats were 85% on room air and improved nicely with oxygen.  He also has fatigue, failure to thrive, headache, lack of oomph, anorexia.  He is also having some chest pain and has taken nitroglycerin.  He will be admitted for further evaluation and treatment of congestive heart failure.  I have already called over to Cardiology and they are aware that he is being admitted and will help in managing of this patient.  PAST MEDICAL HISTORY: 1. 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 2. Coronary artery disease with myocardial infarction in 1981 and     1999. 3. Ischemic heart disease. 4. History of postop AFib. 5. Paroxysmal AFib admission on January 2012 with bradycardia and     pauses reverted back to normal sinus rhythm on his own but ended up     requiring pacemaker to the right chest. 6. Hyperlipidemia. 7. Chronic renal insufficiency and chronic kidney disease with     creatinine 1.9. 8. Anxiety. 9. Insomnia. 10.Hypertension. 11.Peripheral arterial disease. 12.Carotid stenosis. 13.Left carotid occlusion. 14.Thyroid cyst status post FNA with nonneoplastic goiter. 15.Iron deficiency anemia. 16.Glaucoma. 17.Cataracts. 18.Pill  dysphagia. 19.Pacemaker placement. 20.Coronary artery bypass grafting in 1999. 21.Status post nasal deviation surgery. 22.Status post bilateral cataract surgery.  FAMILY HISTORY:  Father died at the age of 68.  Mother died at the age of 70.  Stroke, hypertension, breast cancer, osteoarthritis, and hypertension are listed as family history illnesses.  SOCIAL HISTORY:  Married, two children, three grandchildren, four great grandchildren.  He has seventh grade education.  He is a retired Firefighter.  He quit smoking after 60 pack-years at the age of 75, minimal alcohol use.  ALLERGIES:  IBUPROFEN and ADVIL.  MEDICATION LIST: 1. Actos 45 a day. 2. Alprazolam  0.5 one to two tablets at bedtime. 3. Diltiazem XR 240 mg two per day. 4. Metformin 500 mg 2 tablets p.o. b.i.d. (this was supposed to be     discontinued) 5. Toprol 50 mg p.o. daily. 6. Simvastatin 20 daily. 7. Aspirin 81 daily. 8. Lisinopril 10 mg 2 tablets p.o. b.i.d. 9. Hydrochlorothiazide 25 mg p.o. daily. 10.Bayer contrast test strip, check blood sugars 1 time per day. 11.Primaxin 150 mg every 12 hours. 12.Cosopt eye drops, 1 drop both eyes b.i.d. 13.Nitrostat 0.4 mg sublingual p.r.n. 14.Iron 325 mg per day. 15.B12 administration once a month.  REVIEW OF SYSTEMS:  Positive for fatigue, weight loss, dyspnea on exertion, orthopnea, paroxysmal nocturnal dyspnea, peripheral edema, cough, urinary frequency, nocturia, stiffness, arthritis, dizziness, and gait instability.  All other review of systems was obtained and was negative.  PHYSICAL EXAMINATION:  VITAL SIGNS:  Weight is 179 pounds up 5 ounces, temperature 97.4, pulse rate 60, blood pressure 160/54, satting 85% on room air and 95% on 2 L. GENERAL:  He is well nourished, well hydrated.  He is in no distress. He is wearing his O2 and has better color and less shortness of breath than when he walked in.  Oropharynx is moist. NECK:  Positive for  JVD. RESPIRATORY:  Rales. CARDIOVASCULAR:  S1 and S2 noted.  A 2/6 ejection murmur noted.  Heart rate is slow.  He has got 2+ edema, left greater than right.  He has got a sternotomy.  He has got a right chest pacemaker with a resolved hematoma. ABDOMEN:  Soft, nontender. EXTREMITIES:  He is moving all four appropriately.  ASSESSMENT AND PLAN:  This is an elderly man status post recent cardiac- related issues presenting to my office and being transferred to the hospital for inpatient care of: 1. Congestive heart failure.  Last echo was in February 2012 and     normal EF, although he does have aortic valve issues.  We will     admit, give oxygen, aggressively diurese.  We will discontinue the     Actos.  We will discontinue metformin and try to adjust     medications, get Cardiology involved, and I do not think he needs     another echocardiogram at this time.  We will hold the     hydrochlorothiazide and continue him on the ACE inhibitor.     Continue him on the beta-blocker and hold the calcium channel     blocker at this current time. 2. For the ankylosing spondylitis, I do not think this is an active or     a significant issue.  I put my last workup in the typewritten form,     but no workup needs to be done based on this at this time. 3. For his atrial fibrillation, I will get an EKG and get Cardiology     eval.  We will rule out for MI.  We will continue him off the     amiodarone at this current time. 4. For his chronic kidney disease, probably stage III to stage IV.     His baseline creatinine is 1.9.  We will follow this up in     inpatient. 5. For his underlying hypertension, we will attempt to get better     blood pressure control. 6. For his iron deficiency anemia, he has already had his stool cards     and they were negative.  We will continue on iron.  We will start  Aranesp and may need further workup of this, especially while he is     going to be on the  Pradaxa. 7. Coronary artery disease.  Again, rule out MI as he has taken a     nitroglycerin 8. For underlying anxiety, he will continue on the alprazolam. 9. For his microalbuminuria, last time that this was checked was about     140 and higher. 10.For his carotid stenosis, he had recent carotids checked which     showed Doppler velocities suggest 40% to 59% stenosis in the right     proximal internal carotid and known left internal carotid artery     occlusion. 11.For his type 2 diabetes, we will switch him over to insulin sliding     scale and follow accordingly.  His A1c are usually between 6.8 and     7.5, and the last one I am remember checking was 6.8. 12.For his hyperlipidemia, he will remain on his cholesterol     medicines. 13.For his B12 deficiency, he will remain on B12 shots once a month. 14.He will remain on DVT prophylaxis and I guess the Pradaxa will     count [place] that. 15.He will probably need physical therapy, occupational therapy, and     potential rehab as directed by inpatient consults.     Gwen Pounds, MD     JMR/MEDQ  D:  10/26/2010  T:  10/27/2010  Job:  [id number]  cc:   Colleen Can. Deborah Chalk, M.D.  Electronically Signed by Creola Corn MD on 11/24/2010 09:22:07 PM

## 2010-11-25 ENCOUNTER — Ambulatory Visit (INDEPENDENT_AMBULATORY_CARE_PROVIDER_SITE_OTHER): Payer: Medicare Other | Admitting: Cardiology

## 2010-11-25 ENCOUNTER — Encounter: Payer: Self-pay | Admitting: Cardiology

## 2010-11-25 DIAGNOSIS — I2510 Atherosclerotic heart disease of native coronary artery without angina pectoris: Secondary | ICD-10-CM

## 2010-11-26 ENCOUNTER — Encounter: Payer: Self-pay | Admitting: Cardiology

## 2010-11-26 NOTE — Assessment & Plan Note (Signed)
Steven Callahan comes in today is basically feeling better. He's not having recurrent congestive heart failure nor atrial fibrillation. He feels much better since is off of amiodarone. He's not had any recurrent fevers and is actually getting stronger. His blood pressure readings have been satisfactory in fact are lower. His appetite has improved.  We will decrease his lisinopril 20 mg per day. I'll have him see Dr. Elease Hashimoto and Lawson Fiscal in approximately 2-3 months. He has a followup with Dr. Timothy Lasso

## 2010-11-26 NOTE — Progress Notes (Signed)
Subjective:   Steven Callahan is seen back in office today for a followup of his congestive heart failure. He has a history of a permanent pacemaker and atrial fibrillation. Overall, he's improved since we discontinued his amiodarone. He is less fatigue and has more strength in his legs. He's gradually increasing his activity but still does some lightheadedness when he stands.  He had remote myocardial infarction in 1981. Bypass surgery in December 1999. He's had a history of tobacco abuse, diabetes, hypertension, and hyperlipemia. He had atrial fibrillation with RVR was hospitalized in 2012 had permanent transvenous pacemaker for tachybradycardia syndrome. He was on amiodarone and had progressive decline. He is improved with his overall sense of well-being since off of amiodarone.  Current Outpatient Prescriptions  Medication Sig Dispense Refill  . ALPRAZolam (XANAX) 0.5 MG tablet Take 0.5 mg by mouth at bedtime.        Marland Kitchen aspirin 81 MG tablet Take 81 mg by mouth daily.        . bisacodyl (DULCOLAX) 5 MG EC tablet Take 5 mg by mouth daily as needed.        . dabigatran (PRADAXA) 150 MG CAPS Take 75 mg by mouth every 12 (twelve) hours.       . furosemide (LASIX) 20 MG tablet Take 20 mg by mouth 2 (two) times daily.        . IRON PO Take 1 tablet by mouth daily.        . isosorbide mononitrate (IMDUR) 60 MG 24 hr tablet Take 60 mg by mouth daily.        . Linagliptin (TRADJENTA) 5 MG TABS Take 5 mg by mouth daily.        Marland Kitchen lisinopril (PRINIVIL,ZESTRIL) 20 MG tablet Take 40 mg by mouth 2 (two) times daily.       . metoprolol (TOPROL-XL) 50 MG 24 hr tablet Take 50 mg by mouth daily.        . nitroGLYCERIN (NITROSTAT) 0.4 MG SL tablet Place 0.4 mg under the tongue every 5 (five) minutes as needed.          Allergies  Allergen Reactions  . Ibuprofen Hives    Patient Active Problem List  Diagnoses  . Pacemaker  . HTN (hypertension)  . Carotid artery disease  . Coronary artery disease  . Myocardial  infarction  . Tachy-brady syndrome  . Atrial fibrillation  . Carotid stenosis  . Anemia  . Aortic valve insufficiency  . Fatigue    History  Smoking status  . Former Smoker  . Types: Cigarettes  . Quit date: 09/05/1980  Smokeless tobacco  . Never Used    History  Alcohol Use No    Family History  Problem Relation Age of Onset  . Stroke Father   . Bone cancer Mother   . Coronary artery disease Child   . Coronary artery disease Child     Review of Systems:   The patient denies any heat or cold intolerance.  No weight gain or weight loss.  The patient denies headaches or blurry vision.  There is no cough or sputum production.  The patient denies dizziness.  There is no hematuria or hematochezia.  The patient denies any muscle aches or arthritis.  The patient denies any rash.   There is no history of depression or anxiety.  All other systems were reviewed and are negative.   Physical Exam:   Today's weight 170. Blood pressure 100/50. Heart rate 60.The head is normocephalic and  atraumatic.  Pupils are equally round and reactive to light.  Sclerae nonicteric.  Conjunctiva is clear.  Oropharynx is unremarkable.  There's adequate oral airway.  Neck is supple there are no masses.  Thyroid is not enlarged.  There is no lymphadenopathy.  Lungs are clear.  Chest is symmetric.  Heart shows a regular rate and rhythm.  S1 and S2 are normal.  There is no murmur click or gallop.  Abdomen is soft normal bowel sounds.  There is no organomegaly.  Genital and rectal deferred.  Extremities are without edema.  Peripheral pulses are adequate.  Neurologically intact.  Full range of motion.  The patient is not depressed.  Skin is warm and dry.  Assessment / Plan:

## 2010-11-29 NOTE — Discharge Summary (Signed)
Steven Callahan, Steven Callahan NO.:  [id number]  MEDICAL RECORD NO.:  [id number]           PATIENT TYPE:  I  LOCATION:  4735                         FACILITY:  [facility]  PHYSICIAN:  Gwen Pounds, MD       DATE OF BIRTH:  09/30/1920  DATE OF ADMISSION:  10/26/2010 DATE OF DISCHARGE:  10/28/2010                              DISCHARGE SUMMARY   PRIMARY CARE PHYSICIAN:  Gwen Pounds, MD  CARDIOLOGIST:  Colleen Can. Deborah Chalk, MD  DISCHARGE DIAGNOSES: 1. Congestive heart failure, improved. 2. Resolved hypoxia. 3. Resolved volume overload. 4. Diabetes mellitus type 2. 5. Hypertension. 6. History of pacemaker. 7. Paroxysmal atrial fibrillation now in normal sinus rhythm. 8. Anemia, status post Aranesp. 9. Chronic kidney disease with a baseline creatinine of 2.2. 10.Physical deconditioning. 11.Resolved presyncopal event due to over diuresis, iron, sugar     fluctuations, and medications. 12.Known type 2 diabetes mellitus with mild peripheral neuropathy and     microalbuminuria. 13.Coronary artery disease with myocardial infarction in 1981 and     1999. 14.Ischemic cardiomyopathy. 15.History of postoperative atrial fibrillation. 16.Hyperlipidemia. 17.Anxiety. 18.Insomnia. 19.Hypertension. 20.Peripheral arterial disease. 21.Chronic stenosis with left carotid occlusion. 22.Thyroid cyst status post FNA with nonneoplastic goiter. 23.Glaucoma. 24.Cataracts. 25.Pill dysphagia. 26.Coronary artery bypass grafting in 1999. 27.Status post nasal deviation surgery. 28.Status post bilateral cataract surgery. 29.B12 deficiency.  DISCHARGE MEDICATION LIST: 1. Pradaxa 75 mg every 12 hours. 2. Aranesp 40 mcg subcutaneously once weekly. 3. Iron sulfate 325 mg by mouth twice daily. 4. Lasix 20 mg by mouth daily. 5. Isosorbide mononitrate 60 mg p.o. daily. 6. Tradjenta 5 mg by mouth daily. 7. Lisinopril 40 mg p.o. b.i.d. 8. Alprazolam 0.5 mg by mouth daily at bedtime. 9. Aspirin  81 mg p.o. daily. 10.Bisacodyl 5 mg 1-2 tablets daily at bedtime as needed. 11.Cosopt 1 drop both eyes twice daily. 12.Metoprolol XL 50 mg 1 tablet by mouth daily. 13.Nitroglycerin 0.4 mg 1 tablet under the tongue every 5 minutes as     needed for chest pain up to three doses as needed and then call     911. 14.Simvastatin 20 mg p.o. q.a.m.  He is to stop taking the Actos,     diltiazem CD, hydrochlorothiazide, and metformin.  AFTERCARE FOLLOWUP INSTRUCTIONS:  He is to follow up with me on Monday or Tuesday next week, and Dr. Deborah Chalk  or Dr. Lawson Fiscal  as they indicate. He is to have no concentrated sweets, and have low-sodium heart healthy diet, and increase his activity slowly.  CARE MANAGEMENT ISSUES:  Care management received a referral for CHF screen, and they discussed discharge planning and home health nurse for CHF management.  The patient stated that he did not need the services at that time, and case management signed off.  DISCHARGE PROCEDURES: 1. Telemetry monitoring.  EKG revealing normal sinus rhythm,     nonspecific intraventricular conduction block, and no pacemaker     generator spikes as this is a demand pacemaker, but on telemetry     pacemaker spikes were noted. 2. Cardiology consult for the congestive heart failure per Dr. Excell Seltzer. 3. Physical  therapy eval and treat. 4. Chest x-ray revealing cardiomegaly and probable pulmonary vascular     congestion with effusions and the followup x-ray shows the lungs     are better aerated with residual trace basilar atelectasis.  HISTORY OF PRESENT ILLNESS:  Steven Callahan is an 77 year old man who has been having issues with paroxysmal AFib just at the beginning the year and had an admission in January with new-onset AFib with rapid ventricular response.  He was placed on amiodarone and Pradaxa.  He had issues after starting the amiodarone, and some of these issues were attributed to the amiodarone, and the amiodarone was  stopped.  He eventually had other related issues to rhythm and eventually had pacemaker placed.  He has had several outpatient evaluations with me and Cardiology.  He has had a TEE and other treatment options and adjustments of the pacemaker trying to get him to thrive.  It has been somewhat successful at times and at others he slips and is actually doing pretty poorly.  The patient reported to medical attention to my office on Oct 28, 2010, and was worked in urgently.  His weight has gone up 5 pounds.  Blood pressure was high.  His sats were 85% on room air. He was weak.  He was failing to thrive.  He had increasing shortness of breath, increasing dyspnea on exertion, increasing paroxysmal nocturnal dyspnea, orthopnea, tachypnea, and especially dyspnea on exertion. Chest x-ray was obtained in the office and revealed congestive heart failure.  Other symptoms included fatigue, failure to thrive, headache, lack of oomph, and anorexia.  He had an episode of chest pain and had to take nitroglycerin.  He was admitted for further evaluation and treatment of this new diagnosis of congestive heart failure.  HOSPITAL COURSE:  Steven Callahan is an elderly man status post recent cardiac related issues, presented to my office on Oct 28, 2010, and was worked in urgently, and was diagnosed with congestive heart failure and was transferred to the hospital for inpatient care.  For his congestive heart failure, his last echo was in February 2012.  He had a normal EF, although he does have aortic valve issues and an EF may be overestimated.  We admitted him, we gave him oxygen, we aggressively diuresed him.  We discontinued the Actos.  We discontinued the metformin.  We adjusted the medications.  We got a Cardiology consult, and repeat echocardiogram was placed on hold.  Hydrochlorothiazide was also discontinued, but he was continued on his underlying ACE inhibitor, beta-blocker, calcium channel blockers at this  current time.  Over the next several days, he diuresed appropriately.  His edema came down.  His breathing improved.  His medications were adjusted, and he was eventually ready for discharge.  Other issues dealt with throughout the hospital course include diabetes. He is off the Actos.  He is off the Glucophage.  We will discharge him on the Tradjenta, and tell him not to overdo the sweets.  For volume overload, due to the congestive heart failure that we described above.  We eventually over did the IV Lasix with other medications, and we had to back down to 20 mg oral Lasix as he diuresed extremely well.  He had a goal was to tighten his blood pressure.  He is no longer hypoxic or smothering, and all of his congestive heart failure symptoms were improved, and again we were able to discharge him home in stable condition.  For his hypertension, medications were adjusted.  Please see discharge medication list.  For his pacemaker, this was felt to be working, not an issue.  For his paroxysmal AFib, he is in normal sinus rhythm on the monitor. He remains on Pradaxa and off the amiodarone.  For his anemia, he is status post Aranesp and IV iron.  He is going to be discharged on iron and consider GI workup as needed.  For his chronic kidney disease, his creatinine was 2.2 on discharge and after he leaves, plan is to follow up his creatinine very quickly.  For his physical deconditioning, he underwent physical therapy, did very well with them, matter-of-fact he was markedly stronger despite diuresing him, and he was seen up walking in the halls without problems.  For his hypoxia, again his oxygen was short lived, and he did not need further supplemental oxygen.  He did have a presyncopal event due to over medication, and medications were backed off on, and he is doing markedly better on the day of discharge.  Please see Dr. Earmon Phoenix note.  His assessment is  acute-on-chronic diastolic congestive heart failure, paroxysmal AFib status post permanent pacemaker, coronary artery disease status post coronary artery bypass grafting, hypertension and moderate valvular heart disease with aortic insufficiency, and mitral regurgitation.  The patient was admitted with typical symptoms of clear-cut clinical evidence of congestive heart failure.  He is clearly volume overload, and has a markedly elevated BNP with the BNP elevation at 3877.  CK and troponins were negative, and he ruled out for MI.  IV diuretics were started and followed by oral diuretics.  Dr. Excell Seltzer agreed with the medical regimen of ACE inhibitor, beta-blocker, calcium channel blocker, and recommended the change from the thiazide diuretic to loop diuretic.  Dr. Excell Seltzer also felt that the recent increase is probably congestive heart failure related and did not think pursuing of further investigation at this current time.  LABORATORY DATA IN THE HOSPITAL:  White count of 7.8, hemoglobin 8.7, platelet count of 384.  Sodium 139, potassium 3.8, chloride 101, bicarb 28, glucose 142, BUN 36, creatinine 2.22.  GFR was estimated between 28 and 33.  Liver tests are normal except for slightly low albumin.  CK and troponin I are negative x3.  Iron profile shows a low iron.  Folic acid is in the appropriate range.  Blood sugars range between 91 and 386.  On Oct 28, 2010, Steven Callahan was discharged home in stable condition.  Aftercare followup instructions, he will follow up with me and Dr. Deborah Chalk as an outpatient for further adjustment in medications.     Gwen Pounds, MD     JMR/MEDQ  D:  11/25/2010  T:  11/26/2010  Job:  [id number]  cc:   Colleen Can. Deborah Chalk, M.D.  Electronically Signed by Creola Corn MD on 11/29/2010 09:16:14 AM

## 2010-12-31 ENCOUNTER — Other Ambulatory Visit: Payer: Self-pay | Admitting: *Deleted

## 2010-12-31 MED ORDER — METOPROLOL SUCCINATE ER 50 MG PO TB24: 50.0000 mg | ORAL_TABLET | Freq: Every day | ORAL | Status: DC

## 2011-01-25 ENCOUNTER — Ambulatory Visit (INDEPENDENT_AMBULATORY_CARE_PROVIDER_SITE_OTHER): Payer: Medicare Other | Admitting: Nurse Practitioner

## 2011-01-25 ENCOUNTER — Encounter: Payer: Self-pay | Admitting: Nurse Practitioner

## 2011-01-25 VITALS — BP 192/58 | HR 66 | Ht 72.0 in | Wt 171.2 lb

## 2011-01-25 DIAGNOSIS — I351 Nonrheumatic aortic (valve) insufficiency: Secondary | ICD-10-CM

## 2011-01-25 DIAGNOSIS — Z950 Presence of cardiac pacemaker: Secondary | ICD-10-CM

## 2011-01-25 DIAGNOSIS — I359 Nonrheumatic aortic valve disorder, unspecified: Secondary | ICD-10-CM

## 2011-01-25 DIAGNOSIS — I2510 Atherosclerotic heart disease of native coronary artery without angina pectoris: Secondary | ICD-10-CM

## 2011-01-25 DIAGNOSIS — I10 Essential (primary) hypertension: Secondary | ICD-10-CM

## 2011-01-25 DIAGNOSIS — I4891 Unspecified atrial fibrillation: Secondary | ICD-10-CM

## 2011-01-25 NOTE — Assessment & Plan Note (Signed)
He is followed in device clinic.

## 2011-01-25 NOTE — Assessment & Plan Note (Signed)
Blood pressure remains labile. His wife will continue to monitor and adjust his medicines as needed. She will give him his medicines today when they return home.

## 2011-01-25 NOTE — Assessment & Plan Note (Signed)
He is doing so much better in regards to his well being since being off the amiodarone. He appears to be in sinus by exam. No changes in his medicines. He will be followed by Dr. Elease Hashimoto in 6 months. Patient is agreeable to this plan and will call if any problems develop in the interim.

## 2011-01-25 NOTE — Patient Instructions (Signed)
Continue with your current medicines Continue to monitor your blood pressure. Check it in the right arm. Continue to adjust your medicines for your blood pressure We will see you in 6 months. You will see Dr. Kristeen Miss Call for any problems.

## 2011-01-25 NOTE — Progress Notes (Signed)
Deirdre Priest Date of Birth: 03/10/1921   History of Present Illness: Charli is seen today for a 2 month check. He is seen for Dr. Elease Hashimoto. He is a former patient of Dr. Ronnald Nian. He is now 32. He has multiple issues. These include CAD, remote CABG, diabetes, HTN, aortic insufficiency and atrial fib. He has a pacemaker for tachybrady. He did not tolerate amiodarone and has been off of that for the past few months with considerable improvement. He is no longer weak. He feels stronger. He is eating. He is more active. He has stopped mowing the 3 acres of land. He got terribly over heated last month. Pacemaker has been checked in device clinic. Blood pressure is chronically labile. His wife checks it several times a day and adjusts medicines as necessary. He has not had any medicines today. He has seen Dr. Timothy Lasso recently and had a good report.   Current Outpatient Prescriptions on File Prior to Visit  Medication Sig Dispense Refill  . ALPRAZolam (XANAX) 0.5 MG tablet Take 0.5 mg by mouth at bedtime.        Marland Kitchen aspirin 81 MG tablet Take 81 mg by mouth daily.        . bisacodyl (DULCOLAX) 5 MG EC tablet Take 5 mg by mouth daily as needed.        . dabigatran (PRADAXA) 150 MG CAPS Take 75 mg by mouth every 12 (twelve) hours.       . furosemide (LASIX) 20 MG tablet Take 20 mg by mouth 2 (two) times daily.        . IRON PO Take 1 tablet by mouth daily.        . isosorbide mononitrate (IMDUR) 60 MG 24 hr tablet Take 60 mg by mouth daily.        . Linagliptin (TRADJENTA) 5 MG TABS Take 5 mg by mouth daily.        Marland Kitchen lisinopril (PRINIVIL,ZESTRIL) 20 MG tablet Take 20 mg by mouth 2 (two) times daily.       . metoprolol (TOPROL-XL) 50 MG 24 hr tablet Take 1 tablet (50 mg total) by mouth daily.  30 tablet  6  . nitroGLYCERIN (NITROSTAT) 0.4 MG SL tablet Place 0.4 mg under the tongue every 5 (five) minutes as needed.          Allergies  Allergen Reactions  . Ibuprofen Hives    Past Medical History    Diagnosis Date  . Murmur   . 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 HTN (hypertension)   . Carotid artery disease     TOTALLY OCCLUDED LEFT CAROTID  . Coronary artery disease   . Myocardial infarction 1981  . Atrial fibrillation   . GERD (gastroesophageal reflux disease)   . Carotid stenosis   . Anemia     Past Surgical History  Procedure Date  . Nasal septum surgery   . Cataract extraction, bilateral   . Coronary artery bypass graft 1999  . Pacemaker insertion 07/08/10    MDT pacemaker by Dr Deborah Chalk for tachy/brady syndrome    History  Smoking status  . Former Smoker  . Types: Cigarettes  . Quit date: 09/05/1980  Smokeless tobacco  . Never Used    History  Alcohol Use No    Family History  Problem Relation Age of Onset  . Stroke Father   . Bone cancer Mother   . Coronary artery disease Child   .  Coronary artery disease Child     Review of Systems: The review of systems is positive for labile blood pressures. He gets dizzy when its below 120. No chest pain. He is not short of breath. No cough. No swelling. Labs are checked by his PCP. All other systems were reviewed and are negative.  Physical Exam: BP 192/58  Pulse 66  Ht 6' (1.829 m)  Wt 171 lb 3.2 oz (77.656 kg)  BMI 23.22 kg/m2 Blood pressure is 150/50 in the left arm and 180/60 in the right arm by me. Patient is very pleasant and in no acute distress. He looks younger than his stated age. Skin is warm and dry. Color is normal.  HEENT is unremarkable. Normocephalic/atraumatic. PERRL. Sclera are nonicteric. Neck is supple. No masses. No JVD. Lungs are clear. Cardiac exam shows a regular rate and rhythm. He has a loud grade 3/6 murmur of AI. Abdomen is soft. Extremities are without edema. Gait and ROM are intact. No gross neurologic deficits noted.  LABORATORY DATA:   Assessment / Plan:

## 2011-01-25 NOTE — Assessment & Plan Note (Signed)
He remains asymptomatic.

## 2011-01-25 NOTE — Assessment & Plan Note (Signed)
Has known AI by echo from February of 2012. Will continue to monitor.

## 2011-07-06 ENCOUNTER — Emergency Department (HOSPITAL_COMMUNITY): Payer: Medicare Other

## 2011-07-06 ENCOUNTER — Emergency Department (HOSPITAL_COMMUNITY)
Admission: EM | Admit: 2011-07-06 | Discharge: 2011-07-06 | Disposition: A | Discharge status: Home / Self Care | Payer: Medicare Other | Attending: Emergency Medicine | Admitting: Emergency Medicine

## 2011-07-06 ENCOUNTER — Encounter (HOSPITAL_COMMUNITY): Payer: Self-pay | Admitting: Emergency Medicine

## 2011-07-06 DIAGNOSIS — I10 Essential (primary) hypertension: Secondary | ICD-10-CM | POA: Insufficient documentation

## 2011-07-06 DIAGNOSIS — H539 Unspecified visual disturbance: Secondary | ICD-10-CM | POA: Insufficient documentation

## 2011-07-06 DIAGNOSIS — H43399 Other vitreous opacities, unspecified eye: Secondary | ICD-10-CM | POA: Insufficient documentation

## 2011-07-06 DIAGNOSIS — R0789 Other chest pain: Secondary | ICD-10-CM

## 2011-07-06 LAB — BASIC METABOLIC PANEL
BUN: 32 mg/dL — ABNORMAL HIGH (ref 6–23)
Chloride: 104 mEq/L (ref 96–112)
Creatinine, Ser: 1.79 mg/dL — ABNORMAL HIGH (ref 0.50–1.35)
GFR calc Af Amer: 37 mL/min — ABNORMAL LOW (ref >90)
Glucose, Bld: 209 mg/dL — ABNORMAL HIGH (ref 70–99)
Potassium: 5.2 mEq/L — ABNORMAL HIGH (ref 3.5–5.1)

## 2011-07-06 LAB — POCT I-STAT, CHEM 8
BUN: 32 mg/dL — ABNORMAL HIGH (ref 6–23)
Chloride: 106 mEq/L (ref 96–112)
Glucose, Bld: 206 mg/dL — ABNORMAL HIGH (ref 70–99)
HCT: 38 % — ABNORMAL LOW (ref 39.0–52.0)
Potassium: 5.1 mEq/L (ref 3.5–5.1)

## 2011-07-06 LAB — CBC
HCT: 37.3 % — ABNORMAL LOW (ref 39.0–52.0)
Hemoglobin: 12.1 g/dL — ABNORMAL LOW (ref 13.0–17.0)
MCHC: 32.4 g/dL (ref 30.0–36.0)
MCV: 84 fL (ref 78.0–100.0)
RDW: 14.4 % (ref 11.5–15.5)
WBC: 6.9 10*3/uL (ref 4.0–10.5)

## 2011-07-06 LAB — POCT I-STAT TROPONIN I: Troponin i, poc: 0 ng/mL (ref 0.00–0.08)

## 2011-07-06 MED ORDER — ASPIRIN 81 MG PO CHEW
CHEWABLE_TABLET | ORAL | Status: AC
  Administered 2011-07-06: 324 mg
  Filled 2011-07-06: qty 4

## 2011-07-06 NOTE — ED Notes (Signed)
PT. REPORTS SUBSTERNAL CHEST PAIN WITH SLIGHT TIGHTNESS , DENIES SOB , NAUSEA OR DIAPHORESIS , TOOK 3 NTG SL WITH SLIGHT RELIEF.

## 2011-07-06 NOTE — ED Notes (Signed)
No chest pain now and his bp is staying down

## 2011-07-06 NOTE — ED Notes (Signed)
Pt c/o chest pain since 2100 tonight no sob no nv.  His bp has been elevated tonight.  His bp meds have  Changed around in the past weeks. He recently had lt finger lacerations and has  Just started using them again.  He has not been feeling very well all day .  He is not having chest pain now or since his arrival.  Alert no distress.  Wife at the bedside.

## 2011-07-06 NOTE — ED Provider Notes (Signed)
History     CSN: [id number]  Arrival date & time 07/06/11  0003   First MD Initiated Contact with Patient 07/06/11 0020      Chief Complaint  Patient presents with  . Chest Pain    (Consider location/radiation/quality/duration/timing/severity/associated sxs/prior treatment) HPI PT. REPORTS SUBSTERNAL CHEST PAIN WITH SLIGHT TIGHTNESS , DENIES SOB , NAUSEA OR DIAPHORESIS , TOOK 3 NTG SL WITH SLIGHT RELIEF  Past Medical History  Diagnosis Date  . Murmur   . 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 HTN (hypertension)   . Carotid artery disease     TOTALLY OCCLUDED LEFT CAROTID  . Coronary artery disease   . Myocardial infarction 1981  . Atrial fibrillation   . GERD (gastroesophageal reflux disease)   . Carotid stenosis   . Anemia     Past Surgical History  Procedure Date  . Nasal septum surgery   . Cataract extraction, bilateral   . Coronary artery bypass graft 1999  . Pacemaker insertion 07/08/10    MDT pacemaker by Dr Deborah Chalk for tachy/brady syndrome    Family History  Problem Relation Age of Onset  . Stroke Father   . Bone cancer Mother   . Coronary artery disease Child   . Coronary artery disease Child     History  Substance Use Topics  . Smoking status: Former Smoker    Types: Cigarettes    Quit date: 09/05/1980  . Smokeless tobacco: Never Used  . Alcohol Use: No      Review of Systems  All other systems reviewed and are negative.    Allergies  Ibuprofen  Home Medications   Current Outpatient Rx  Name Route Sig Dispense Refill  . ALPRAZOLAM 0.5 MG PO TABS Oral Take 0.5 mg by mouth at bedtime.      . ASPIRIN 81 MG PO TABS Oral Take 81 mg by mouth daily.      Marland Kitchen BISACODYL 5 MG PO TBEC Oral Take 5 mg by mouth daily as needed.      Marland Kitchen HYDRALAZINE HCL 25 MG PO TABS Oral Take 25 mg by mouth 2 (two) times daily.      Marland Kitchen HYDROCHLOROTHIAZIDE 25 MG PO TABS Oral Take 25 mg by mouth daily.      . ISOSORBIDE MONONITRATE ER 60 MG  PO TB24 Oral Take 60 mg by mouth daily.      Marland Kitchen LINAGLIPTIN 5 MG PO TABS Oral Take 5 mg by mouth daily.      Marland Kitchen METOPROLOL SUCCINATE ER 50 MG PO TB24 Oral Take 1 tablet (50 mg total) by mouth daily. 30 tablet 6  . NITROGLYCERIN 0.4 MG SL SUBL Sublingual Place 0.4 mg under the tongue every 5 (five) minutes as needed.      Marland Kitchen AMLODIPINE BESYLATE 5 MG PO TABS Oral Take 1 tablet (5 mg total) by mouth daily. 30 tablet 11  . RIVAROXABAN 15 MG PO TABS Oral Take 1 tablet (15 mg total) by mouth daily. 30 tablet 6    BP 129/43  Pulse 64  Temp(Src) 97.4 F (36.3 C) (Oral)  Resp 18  SpO2 97%  Physical Exam  Nursing note and vitals reviewed. Constitutional: He is oriented to person, place, and time. He appears well-developed and well-nourished. No distress.  HENT:  Head: Normocephalic and atraumatic.  Eyes: Pupils are equal, round, and reactive to light.  Neck: Normal range of motion.  Cardiovascular: Normal rate and intact distal pulses.  EKG interpreted by me.  No changes from previous tracing  Pulmonary/Chest: No respiratory distress.  Abdominal: Normal appearance. He exhibits no distension.  Musculoskeletal: Normal range of motion.  Neurological: He is alert and oriented to person, place, and time. No cranial nerve deficit.  Skin: Skin is warm and dry. No rash noted.  Psychiatric: He has a normal mood and affect. His behavior is normal.    ED Course  Procedures (including critical care time)  Labs Reviewed  CBC - Abnormal; Notable for the following:    Hemoglobin 12.1 (*)    HCT 37.3 (*)    All other components within normal limits  BASIC METABOLIC PANEL - Abnormal; Notable for the following:    Potassium 5.2 (*)    Glucose, Bld 209 (*)    BUN 32 (*)    Creatinine, Ser 1.79 (*)    GFR calc non Af Amer 32 (*)    GFR calc Af Amer 37 (*)    All other components within normal limits  PROTIME-INR - Abnormal; Notable for the following:    Prothrombin Time 16.3 (*)    All other  components within normal limits  POCT I-STAT, CHEM 8 - Abnormal; Notable for the following:    BUN 32 (*)    Creatinine, Ser 1.70 (*)    Glucose, Bld 206 (*)    Hemoglobin 12.9 (*)    HCT 38.0 (*)    All other components within normal limits  POCT I-STAT TROPONIN I  LAB REPORT - SCANNED  LAB REPORT - SCANNED  I-STAT TROPONIN I   No results found. After treatment in the ED the patient feels back to baseline and wants to go home.  I spoke with Dr. Clelia Croft discussed the case with him.  The patient is insisting on going home in his back to his baseline.  We will recommend that he increase his hydralazine to 50 mg twice a day and Dr. Clelia Croft will) be seen in office week.  Patient promised me he would return if his symptoms return. 1. Systolic hypertension   2. Chest discomfort       MDM          Nelia Shi, MD 07/15/11 2320

## 2011-07-11 ENCOUNTER — Ambulatory Visit (INDEPENDENT_AMBULATORY_CARE_PROVIDER_SITE_OTHER): Payer: Medicare Other | Admitting: Internal Medicine

## 2011-07-11 ENCOUNTER — Encounter: Payer: Self-pay | Admitting: Internal Medicine

## 2011-07-11 DIAGNOSIS — I10 Essential (primary) hypertension: Secondary | ICD-10-CM

## 2011-07-11 DIAGNOSIS — I495 Sick sinus syndrome: Secondary | ICD-10-CM

## 2011-07-11 DIAGNOSIS — I4891 Unspecified atrial fibrillation: Secondary | ICD-10-CM

## 2011-07-11 DIAGNOSIS — I701 Atherosclerosis of renal artery: Secondary | ICD-10-CM

## 2011-07-11 DIAGNOSIS — I2510 Atherosclerotic heart disease of native coronary artery without angina pectoris: Secondary | ICD-10-CM

## 2011-07-11 LAB — PACEMAKER DEVICE OBSERVATION
AL THRESHOLD: 0.75 V
ATRIAL PACING PM: 59
BAMS-0001: 150 {beats}/min
BATTERY VOLTAGE: 2.79 V
RV LEAD AMPLITUDE: 22.4 mv

## 2011-07-11 MED ORDER — RIVAROXABAN 15 MG PO TABS: 15.0000 mg | ORAL_TABLET | Freq: Every day | ORAL | Status: DC

## 2011-07-11 MED ORDER — AMLODIPINE BESYLATE 5 MG PO TABS: 5.0000 mg | ORAL_TABLET | Freq: Every day | ORAL | Status: DC

## 2011-07-11 NOTE — Patient Instructions (Addendum)
Your physician wants you to follow-up in: 6 months with device clinic and 12 months with Dr Johney Frame Bonita Quin will receive a reminder letter in the mail two months in advance. If you don't receive a letter, please call our office to schedule the follow-up appointment.  Your physician has requested that you have a renal artery duplex. During this test, an ultrasound is used to evaluate blood flow to the kidneys. Allow one hour for this exam. Do not eat after midnight the day before and avoid carbonated beverages. Take your medications as you usually do.  Your physician has recommended you make the following change in your medication:  1) Start Norvasc(Amlodipine) 5mg  daily 2) Stop Pradaxa 3) Start Xarelto 15mg  daily  If your blood pressure is still elevated in one week  Please increase your Norvasc to 10mg  daily

## 2011-07-13 NOTE — Assessment & Plan Note (Addendum)
Significantly elevated today Add norvasc 2 gram sodium diet We will check renal doppler to evaluate for RAS He should contact our office if BP is not improved.    Dopplers reveal small infrarenal AAA.  Repeat study in 1 year recommended.

## 2011-07-13 NOTE — Progress Notes (Signed)
PCP:  Gwen Pounds, MD, MD  The patient presents today for routine electrophysiology followup.  Since last being seen in our clinic, the patient reports doing reasonably well.   He has had "indegestion" recently.  He describes this as chest pain in the evenings.  He denies exertional symptoms.  He has recently found that his blood pressure has been quite elevated as well. Today, he denies symptoms of palpitations, shortness of breath, lower extremity edema, dizziness, presyncope, or syncope.  The patient feels that he is tolerating medications without difficulties and is otherwise without complaint today.   Past Medical History  Diagnosis Date  . Murmur   . 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 HTN (hypertension)   . Carotid artery disease     TOTALLY OCCLUDED LEFT CAROTID  . Coronary artery disease   . Myocardial infarction 1981  . Atrial fibrillation   . GERD (gastroesophageal reflux disease)   . Carotid stenosis   . Anemia    Past Surgical History  Procedure Date  . Nasal septum surgery   . Cataract extraction, bilateral   . Coronary artery bypass graft 1999  . Pacemaker insertion 07/08/10    MDT pacemaker by Dr Deborah Chalk for tachy/brady syndrome    Current Outpatient Prescriptions  Medication Sig Dispense Refill  . ALPRAZolam (XANAX) 0.5 MG tablet Take 0.5 mg by mouth at bedtime.        Marland Kitchen aspirin 81 MG tablet Take 81 mg by mouth daily.        . bisacodyl (DULCOLAX) 5 MG EC tablet Take 5 mg by mouth daily as needed.        . hydrALAZINE (APRESOLINE) 25 MG tablet Take 25 mg by mouth 2 (two) times daily.        . hydrochlorothiazide (HYDRODIURIL) 25 MG tablet Take 25 mg by mouth daily.        . isosorbide mononitrate (IMDUR) 60 MG 24 hr tablet Take 60 mg by mouth daily.        . Linagliptin (TRADJENTA) 5 MG TABS Take 5 mg by mouth daily.        . metoprolol (TOPROL-XL) 50 MG 24 hr tablet Take 1 tablet (50 mg total) by mouth daily.  30 tablet  6  .  nitroGLYCERIN (NITROSTAT) 0.4 MG SL tablet Place 0.4 mg under the tongue every 5 (five) minutes as needed.        Marland Kitchen amLODipine (NORVASC) 5 MG tablet Take 1 tablet (5 mg total) by mouth daily.  30 tablet  11  . Rivaroxaban (XARELTO) 15 MG TABS tablet Take 1 tablet (15 mg total) by mouth daily.  30 tablet  6    Allergies  Allergen Reactions  . Ibuprofen Hives    History   Social History  . Marital Status: Married    Spouse Name: N/A    Number of Children: N/A  . Years of Education: N/A   Occupational History  . Not on file.   Social History Main Topics  . Smoking status: Former Smoker    Types: Cigarettes    Quit date: 09/05/1980  . Smokeless tobacco: Never Used  . Alcohol Use: No  . Drug Use: No  . Sexually Active: Not on file   Other Topics Concern  . Not on file   Social History Narrative  . No narrative on file    Family History  Problem Relation Age of Onset  . Stroke Father   .  Bone cancer Mother   . Coronary artery disease Child   . Coronary artery disease Child     ROS-  All systems are reviewed and are negative except as outlined in the HPI above   Physical Exam: Filed Vitals:   07/11/11 1139  BP: 191/59  Pulse: 77  Weight: 175 lb (79.379 kg)    GEN- The patient is well appearing, alert and oriented x 3 today.   Head- normocephalic, atraumatic Eyes-  Sclera clear, conjunctiva pink Ears- hearing intact Oropharynx- clear Neck- supple, no JVP Lymph- no cervical lymphadenopathy Lungs- Clear to ausculation bilaterally, normal work of breathing Chest- pacemaker pocket is well healed Heart- Regular rate and rhythm, no murmurs, rubs or gallops, PMI not laterally displaced GI- soft, NT, ND, + BS Extremities- no clubbing, cyanosis, or edema  ekg today reveals A pacing, nonspecific ST/T changes  Pacemaker interrogation- reviewed in detail today,  See PACEART report  Assessment and Plan:

## 2011-07-13 NOTE — Assessment & Plan Note (Signed)
Switch pradaxa to xarelto as above

## 2011-07-13 NOTE — Assessment & Plan Note (Signed)
Normal pacemaker function See Pace Art report No changes today  

## 2011-07-13 NOTE — Assessment & Plan Note (Signed)
Continue medical management long term given advanced age. I am concerned that his evening chest pain may be related to pradaxa (dyspepsia). I will therefore stop pradaxa today and start xarelto 15mg  daily. If chest discomfort does not improve, he is instructed to contact our office.  Norvasc added for blood pressure.  This would also be good for an antianginal medication.

## 2011-07-18 ENCOUNTER — Other Ambulatory Visit: Payer: Self-pay

## 2011-07-18 MED ORDER — METOPROLOL SUCCINATE ER 50 MG PO TB24: 50.0000 mg | ORAL_TABLET | Freq: Every day | ORAL | Status: DC

## 2011-07-18 NOTE — Telephone Encounter (Signed)
..   Requested Prescriptions   Signed Prescriptions Disp Refills  . metoprolol succinate (TOPROL-XL) 50 MG 24 hr tablet 30 tablet 11    Sig: Take 1 tablet (50 mg total) by mouth daily.    Authorizing Provider: Hillis Range D    Ordering User: Christella Hartigan, Braelyn Bordonaro Judie Petit

## 2011-07-27 ENCOUNTER — Ambulatory Visit (INDEPENDENT_AMBULATORY_CARE_PROVIDER_SITE_OTHER): Payer: Medicare Other | Admitting: Cardiovascular Disease

## 2011-07-27 ENCOUNTER — Encounter: Payer: Self-pay | Admitting: Cardiovascular Disease

## 2011-07-27 VITALS — BP 181/67 | HR 80 | Ht 72.0 in | Wt 173.1 lb

## 2011-07-27 DIAGNOSIS — I701 Atherosclerosis of renal artery: Secondary | ICD-10-CM

## 2011-07-27 DIAGNOSIS — I10 Essential (primary) hypertension: Secondary | ICD-10-CM

## 2011-07-27 DIAGNOSIS — I4891 Unspecified atrial fibrillation: Secondary | ICD-10-CM

## 2011-07-27 MED ORDER — NITROGLYCERIN 0.4 MG SL SUBL: 0.4000 mg | SUBLINGUAL_TABLET | SUBLINGUAL | Status: AC | PRN

## 2011-07-27 MED ORDER — AMLODIPINE BESYLATE 10 MG PO TABS: 10.0000 mg | ORAL_TABLET | Freq: Every day | ORAL | Status: DC

## 2011-07-27 NOTE — Patient Instructions (Signed)
Your physician wants you to follow-up in: 6 months  You will receive a reminder letter in the mail two months in advance. If you don't receive a letter, please call our office to schedule the follow-up appointment.  Follow up in 1 month for a nursing visit for a blood pressure check  Your physician has recommended you make the following change in your medication:   Increase amlodipine to 10 mg daily  Your physician recommends that you return for lab work in: 3 month/ bmp

## 2011-07-27 NOTE — Progress Notes (Signed)
Deirdre Priest Date of Birth  12/02/1920 [facility]     [facility]  [address]    [address]   [address] [place], [place]  [address]    [place], [place]  [address] [phone]  Fax  [phone]  [phone]  Fax [phone]  Problem list: 1. Atrial fibrillation 2. Pacemaker 3. Coronary artery disease-status post remote myocardial infarction in 1981. He status post CABG in December, 1999 (LIMA to LAD, saphenous vein graft to the acute marginal, saphenous vein graft to the PDA, saphenous vein graft to the first diagonal) 4. Diabetes mellitus 5. Hypertension 6. Hyperlipidemia 7. H. fibrillation-postoperative atrial ablation following his bypass surgery  History of Present Illness:  Dekari  been doing fairly well but has been having high blood pressure readings recently.  He is scheduled for renal duplex scan tomorrow  Current Outpatient Prescriptions on File Prior to Visit  Medication Sig Dispense Refill  . ALPRAZolam (XANAX) 0.5 MG tablet Take 0.5 mg by mouth at bedtime.        Marland Kitchen amLODipine (NORVASC) 5 MG tablet Take 1 tablet (5 mg total) by mouth daily.  30 tablet  11  . aspirin 81 MG tablet Take 81 mg by mouth daily.        . bisacodyl (DULCOLAX) 5 MG EC tablet Take 5 mg by mouth daily as needed.        . hydrALAZINE (APRESOLINE) 25 MG tablet Take 50 mg by mouth 3 (three) times daily.       . hydrochlorothiazide (HYDRODIURIL) 25 MG tablet Take 25 mg by mouth daily.        . isosorbide mononitrate (IMDUR) 60 MG 24 hr tablet Take 60 mg by mouth daily.        . Linagliptin (TRADJENTA) 5 MG TABS Take 5 mg by mouth daily.        . metoprolol succinate (TOPROL-XL) 50 MG 24 hr tablet Take 1 tablet (50 mg total) by mouth daily.  30 tablet  11  . nitroGLYCERIN (NITROSTAT) 0.4 MG SL tablet Place 0.4 mg under the tongue every 5 (five) minutes as needed.        . Rivaroxaban (XARELTO) 15 MG TABS tablet Take 1 tablet (15 mg total) by mouth daily.  30 tablet  6     Allergies  Allergen Reactions  . Ibuprofen Hives    Past Medical History  Diagnosis Date  . Murmur   . 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 HTN (hypertension)   . Carotid artery disease     TOTALLY OCCLUDED LEFT CAROTID  . Coronary artery disease   . Myocardial infarction 1981  . Atrial fibrillation   . GERD (gastroesophageal reflux disease)   . Carotid stenosis   . Anemia     Past Surgical History  Procedure Date  . Nasal septum surgery   . Cataract extraction, bilateral   . Coronary artery bypass graft 1999  . Pacemaker insertion 07/08/10    MDT pacemaker by Dr Deborah Chalk for tachy/brady syndrome    History  Smoking status  . Former Smoker  . Types: Cigarettes  . Quit date: 09/05/1980  Smokeless tobacco  . Never Used    History  Alcohol Use No    Family History  Problem Relation Age of Onset  . Stroke Father   . Bone cancer Mother   . Coronary artery disease Child   . Coronary artery disease Child  Reviw of Systems:  Reviewed in the HPI.  All other systems are negative.  Physical Exam: BP 181/67  Pulse 80  Ht 6' (1.829 m)  Wt 173 lb 1.9 oz (78.527 kg)  BMI 23.48 kg/m2 The patient is alert and oriented x 3.  The mood and affect are normal.   Skin: warm and dry.  Color is normal.    HEENT:   1+ carotids, bilateral carotid bruits. No JVD  Lungs: clear   Heart: RR, 2/6 diastolic murmur, soft systolic murmur    Abdomen: + abdominal bruits  Extremities:  No c/c/e  Neuro:  Non focal    ECG:   Assessment / Plan:

## 2011-07-27 NOTE — Assessment & Plan Note (Signed)
Colchicines blood pressure is certainly elevated. We will increase his amlodipine to 10 mg a day. He is scheduled have a renal artery duplex scan tomorrow.  He has abdominal bruits and may have renal artery stenosis.  I'll have him come in for a nurse visit in one month. I'll see him again in 3 months. We'll check a basic metabolic profile.

## 2011-07-28 ENCOUNTER — Encounter (INDEPENDENT_AMBULATORY_CARE_PROVIDER_SITE_OTHER): Payer: Medicare Other | Admitting: Cardiology

## 2011-07-28 DIAGNOSIS — I10 Essential (primary) hypertension: Secondary | ICD-10-CM

## 2011-07-28 DIAGNOSIS — I714 Abdominal aortic aneurysm, without rupture: Secondary | ICD-10-CM

## 2011-07-28 DIAGNOSIS — I701 Atherosclerosis of renal artery: Secondary | ICD-10-CM

## 2011-08-09 ENCOUNTER — Other Ambulatory Visit: Payer: Self-pay | Admitting: Internal Medicine

## 2011-08-09 DIAGNOSIS — I714 Abdominal aortic aneurysm, without rupture: Secondary | ICD-10-CM

## 2011-09-15 ENCOUNTER — Other Ambulatory Visit: Payer: Self-pay | Admitting: Cardiology

## 2011-09-15 NOTE — Telephone Encounter (Signed)
Refilled hctz 

## 2011-10-26 ENCOUNTER — Ambulatory Visit: Payer: Medicare Other | Admitting: Cardiovascular Disease

## 2011-11-23 ENCOUNTER — Encounter: Payer: Self-pay | Admitting: Neurosurgery

## 2011-11-24 ENCOUNTER — Ambulatory Visit (INDEPENDENT_AMBULATORY_CARE_PROVIDER_SITE_OTHER): Payer: Medicare Other | Admitting: Neurosurgery

## 2011-11-24 ENCOUNTER — Encounter: Payer: Self-pay | Admitting: Neurosurgery

## 2011-11-24 ENCOUNTER — Ambulatory Visit (INDEPENDENT_AMBULATORY_CARE_PROVIDER_SITE_OTHER): Payer: Medicare Other | Admitting: *Deleted

## 2011-11-24 VITALS — BP 162/54 | HR 65 | Resp 16 | Ht 71.5 in | Wt 173.1 lb

## 2011-11-24 DIAGNOSIS — I6529 Occlusion and stenosis of unspecified carotid artery: Secondary | ICD-10-CM

## 2011-11-24 NOTE — Progress Notes (Addendum)
[facility] [place] [facility] HISTORY AND PHYSICAL   CC: Annual carotid duplex for known stenosis Referring Physician: Edilia Bo  History of Present Illness: 78 year old male patient of Dr. Edilia Bo followed for a left ICA occlusion and known carotid stenosis on the right. Patient does report some "dizzy spells", and some what appears to be a vertigo affect when arising quickly or after having worked out in the heat for any amount of time. Patient denies any signs or symptoms of CVA, TIA, amaurosis fugax or any neural deficit. 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 The patient also gives a history of having a pacemaker, being diabetic as well as hypertensive. He will followup with his cardiologist in about 8 weeks as well as his ophthalmologist.  Past Medical History  Diagnosis Date  . Murmur   . 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 HTN (hypertension)   . Carotid artery disease     TOTALLY OCCLUDED LEFT CAROTID  . Coronary artery disease   . Myocardial infarction 1981  . Atrial fibrillation   . GERD (gastroesophageal reflux disease)   . Carotid stenosis   . Anemia     ROS: [x]  Positive   [ ]  Denies    General: [ ]  Weight loss, [ ]  Fever, [ ]  chills Neurologic: [x ] Dizziness, [ ]  Blackouts, [ ]  Seizure, no true amaurosis but does have complaints of visual disturbance with or arising quickly or after working in the heat [ ]  Stroke, [ ]  "Mini stroke", [ ]  Slurred speech, [ ]  Temporary blindness; [ ]  weakness in arms or legs, [ ]  Hoarseness Cardiac: [x ] Chest pain/pressure, [ ]  Shortness of breath at rest [ ]  Shortness of breath with exertion, [ ]  Atrial fibrillation or irregular heartbeat Vascular: [ ]  Pain in legs with walking, [ ]  Pain in legs at rest, [ ]  Pain in legs at night,  [ ]  Non-healing ulcer, [ ]  Blood clot in vein/DVT,   Pulmonary: [ ]  Home oxygen, [ ]  Productive cough, [ ]  Coughing up  blood, [ ]  Asthma,  [ ]  Wheezing Musculoskeletal:  [ ]  Arthritis, [ ]  Low back pain, [ ]  Joint pain Hematologic: [ ]  Easy Bruising, [ ]  Anemia; [ ]  Hepatitis Gastrointestinal: [ ]  Blood in stool, [ ]  Gastroesophageal Reflux/heartburn, [ ]  Trouble swallowing Urinary: [ ]  chronic Kidney disease, [ ]  on HD - [ ]  MWF or [ ]  TTHS, [ ]  Burning with urination, [ ]  Difficulty urinating Skin: [ ]  Rashes, [ ]  Wounds Psychological: [ ]  Anxiety, [ ]  Depression   Social History History  Substance Use Topics  . Smoking status: Former Smoker    Types: Cigarettes    Quit date: 09/05/1980  . Smokeless tobacco: Never Used  . Alcohol Use: No    Family History Family History  Problem Relation Age of Onset  . Stroke Father   . Bone cancer Mother   . Coronary artery disease Child   . Coronary artery disease Child     Allergies  Allergen Reactions  . Ibuprofen Hives    Current Outpatient Prescriptions  Medication Sig Dispense Refill  . ALPRAZolam (XANAX) 0.5 MG tablet Take 0.5 mg by mouth at bedtime.        Marland Kitchen amLODipine (NORVASC) 10 MG tablet Take 1 tablet (10 mg total) by mouth daily.  30 tablet  11  . aspirin 81 MG tablet Take 81  mg by mouth daily.        . bisacodyl (DULCOLAX) 5 MG EC tablet Take 5 mg by mouth daily as needed.        . hydrALAZINE (APRESOLINE) 25 MG tablet Take 50 mg by mouth 3 (three) times daily.       . hydrochlorothiazide (HYDRODIURIL) 25 MG tablet TAKE 1 TABLET BY MOUTH DAILY  30 tablet  6  . isosorbide mononitrate (IMDUR) 60 MG 24 hr tablet Take 60 mg by mouth daily.        . Linagliptin (TRADJENTA) 5 MG TABS Take 5 mg by mouth daily.        . metoprolol succinate (TOPROL-XL) 50 MG 24 hr tablet Take 1 tablet (50 mg total) by mouth daily.  30 tablet  11  . nitroGLYCERIN (NITROSTAT) 0.4 MG SL tablet Place 1 tablet (0.4 mg total) under the tongue every 5 (five) minutes as needed.  25 tablet  5  . Rivaroxaban (XARELTO) 15 MG TABS tablet Take 1 tablet (15 mg total) by  mouth daily.  30 tablet  6    Physical Examination  Filed Vitals:   11/24/11 1510  BP: 162/54  Pulse: 65  Resp: 16    Body mass index is 23.81 kg/(m^2).  General:  WDWN in NAD Gait: Normal HEENT: WNL Eyes: Pupils equal Pulmonary: normal non-labored breathing , without Rales, rhonchi,  wheezing Cardiac: RRR, without  Murmurs, rubs or gallops; Abdomen: soft, NT, no masses Skin: no rashes, ulcers noted  Vascular Exam Pulses: 2+ radial pulses bilaterally Carotid bruits: Left-sided occlusion, right sided bruit to auscultation Extremities without ischemic changes, no Gangrene , no cellulitis; no open wounds;  Musculoskeletal: no muscle wasting or atrophy   Neurologic: A&O X 3; Appropriate Affect ; SENSATION: normal; MOTOR FUNCTION:  moving all extremities equally. Speech is fluent/normal  Non-Invasive Vascular Imaging CAROTID DUPLEX 11/24/2011  Right ICA 40 - 59 % stenosis Left ICA 0ccluded stenosis   ASSESSMENT/PLAN: 78 year-old male patient with some symptoms of vertigo of unknown etiology. His right side carotid velocities have not changed in almost 2 years with an occluded left carotid. Given his current complaints of encouraged him to call his cardiologist and maybe move his appointment up from July, and we will bring him back here for in 6 months for repeat carotid duplex. Their questions were encouraged and answered, they are in agreement with this plan.  Lauree Chandler ANP   Clinic MD: Darrick Penna

## 2011-11-25 NOTE — Progress Notes (Signed)
Addended by: Sharee Pimple on: 11/25/2011 08:02 AM   Modules accepted: Orders

## 2011-12-01 NOTE — Procedures (Unsigned)
CAROTID DUPLEX EXAM  INDICATION:  Carotid disease/left ICA occlusion  HISTORY: Diabetes:  Yes Cardiac:  MI, CABG Hypertension:  Yes Smoking:  Previous Previous Surgery:  No CV History:  Asymptomatic Amaurosis Fugax No, Paresthesias No, Hemiparesis No                                      RIGHT             LEFT Brachial systolic pressure: Brachial Doppler waveforms: Vertebral direction of flow:        Antegrade DUPLEX VELOCITIES (cm/sec) CCA peak systolic                   155 ECA peak systolic                   199 ICA peak systolic                   206 ICA end diastolic                   43 PLAQUE MORPHOLOGY:                  Heterogeneous PLAQUE AMOUNT:                      Moderate PLAQUE LOCATION:                    ICA/ECA/CCA  IMPRESSION: 1. Doppler velocity suggests a high end 40%-59% stenosis of the right     proximal internal carotid artery. 2. Right external carotid artery stenosis noted. 3. No significant change in the velocities of the right carotid     arteries noted when compared to the previous exam on 07/29/2010.  ___________________________________________ Di Kindle. Edilia Bo, M.D.  CH/MEDQ  D:  11/28/2011  T:  11/28/2011  Job:  [id number]

## 2011-12-15 ENCOUNTER — Other Ambulatory Visit (HOSPITAL_COMMUNITY): Payer: Self-pay | Admitting: *Deleted

## 2011-12-19 ENCOUNTER — Encounter (HOSPITAL_COMMUNITY)
Admission: RE | Admit: 2011-12-19 | Discharge: 2011-12-19 | Disposition: A | Discharge status: Home / Self Care | Payer: Medicare Other | Source: Ambulatory Visit | Attending: Internal Medicine | Admitting: Internal Medicine

## 2011-12-19 DIAGNOSIS — D649 Anemia, unspecified: Secondary | ICD-10-CM | POA: Insufficient documentation

## 2011-12-19 MED ORDER — SODIUM CHLORIDE 0.9 % IV SOLN
500.0000 mL | Freq: Once | INTRAVENOUS | Status: AC
  Administered 2011-12-19: 500 mL via INTRAVENOUS

## 2011-12-19 MED ORDER — FUROSEMIDE 10 MG/ML IJ SOLN
40.0000 mg | Freq: Once | INTRAMUSCULAR | Status: AC
  Administered 2011-12-19: 40 mg via INTRAVENOUS
  Filled 2011-12-19: qty 4

## 2011-12-19 NOTE — Discharge Instructions (Signed)
Refer to printed sheet for next appointment. Short Stay Phone # [phone] YOUR NEXT APPOINTMENT IN SHORT STAY FOR LAB WORK AND INJECTON IN 2 WEEKS WILL BE  01/03/12 TUESDAY AT 1100 AM    BLOOD TRANSFUSION  A blood transfusion replaces your blood or some of its parts. Blood is replaced when you have lost blood because of surgery, an accident, or for severe blood conditions like anemia. You can donate blood to be used on yourself if you have a planned surgery. If you lose blood during that surgery, your own blood can be given back to you. Any blood given to you is checked to make sure it matches your blood type. Your temperature, blood pressure, and heart rate (vital signs) will be checked often.  GET HELP RIGHT AWAY IF:   You feel sick to your stomach (nauseous) or throw up (vomit).   You have watery poop (diarrhea).   You have shortness of breath or trouble breathing.   You have blood in your pee (urine) or have dark colored pee.   You have chest pain or tightness.   Your eyes or skin turn yellow (jaundice).   You have a temperature by mouth above 102 F (38.9 C), not controlled by medicine.   You start to shake and have chills.   You develop a a red rash (hives) or feel itchy.   You develop lightheadedness or feel confused.   You develop back, joint, or muscle pain.   You do not feel hungry (lost appetite).   You feel tired, restless, or nervous.   You develop belly (abdominal) cramps.  Document Released: 09/09/2008 Document Revised: 06/02/2011 Document Reviewed: 09/09/2008 [facility] Patient Information 2012 [place], [place].     ARANESP What is this medicine? EPOETIN ALFA (e POE e tin AL fa) helps your body make more red blood cells. This medicine is used to treat anemia caused by chronic kidney failure, cancer chemotherapy, or HIV-therapy. It may also be used before surgery if you have anemia. This medicine may be used for other purposes; ask your health care provider  or pharmacist if you have questions. What should I tell my health care provider before I take this medicine? They need to know if you have any of these conditions: -blood clotting disorders -cancer patient not on chemotherapy -cystic fibrosis -heart disease, such as angina or heart failure -hemoglobin level of 12 g/dL or greater -high blood pressure -low levels of folate, iron, or vitamin B12 -seizures -an unusual or allergic reaction to erythropoietin, albumin, benzyl alcohol, hamster proteins, other medicines, foods, dyes, or preservatives -pregnant or trying to get pregnant -breast-feeding How should I use this medicine? This medicine is for injection into a vein or under the skin. It is usually given by a health care professional in a hospital or clinic setting. If you get this medicine at home, you will be taught how to prepare and give this medicine. Use exactly as directed. Take your medicine at regular intervals. Do not take your medicine more often than directed. It is important that you put your used needles and syringes in a special sharps container. Do not put them in a trash can. If you do not have a sharps container, call your pharmacist or healthcare provider to get one. Talk to your pediatrician regarding the use of this medicine in children. While this drug may be prescribed for selected conditions, precautions do apply. Overdosage: If you think you have taken too much of this medicine contact a  poison control center or emergency room at once. NOTE: This medicine is only for you. Do not share this medicine with others. What if I miss a dose? If you miss a dose, take it as soon as you can. If it is almost time for your next dose, take only that dose. Do not take double or extra doses. What may interact with this medicine? Do not take this medicine with any of the following medications: -darbepoetin alfa This list may not describe all possible interactions. Give your health  care provider a list of all the medicines, herbs, non-prescription drugs, or dietary supplements you use. Also tell them if you smoke, drink alcohol, or use illegal drugs. Some items may interact with your medicine. What should I watch for while using this medicine? Visit your prescriber or health care professional for regular checks on your progress and for the needed blood tests and blood pressure measurements. It is especially important for the doctor to make sure your hemoglobin level is in the desired range, to limit the risk of potential side effects and to give you the best benefit. Keep all appointments for any recommended tests. Check your blood pressure as directed. Ask your doctor what your blood pressure should be and when you should contact him or her. As your body makes more red blood cells, you may need to take iron, folic acid, or vitamin B supplements. Ask your doctor or health care provider which products are right for you. If you have kidney disease continue dietary restrictions, even though this medication can make you feel better. Talk with your doctor or health care professional about the foods you eat and the vitamins that you take. What side effects may I notice from receiving this medicine? Side effects that you should report to your doctor or health care professional as soon as possible: -allergic reactions like skin rash, itching or hives, swelling of the face, lips, or tongue -breathing problems -changes in vision -chest pain -confusion, trouble speaking or understanding -feeling faint or lightheaded, falls -high blood pressure -muscle aches or pains -pain, swelling, warmth in the leg -rapid weight gain -severe headaches -sudden numbness or weakness of the face, arm or leg -trouble walking, dizziness, loss of balance or coordination -seizures (convulsions) -swelling of the ankles, feet, hands -unusually weak or tired Side effects that usually do not require medical  attention (report to your doctor or health care professional if they continue or are bothersome): -diarrhea -fever, chills (flu-like symptoms) -headaches -nausea, vomiting -redness, stinging, or swelling at site where injected This list may not describe all possible side effects. Call your doctor for medical advice about side effects. You may report side effects to FDA at 1-800-FDA-1088. Where should I keep my medicine? Keep out of the reach of children. Store in a refrigerator between 2 and 8 degrees C (36 and 46 degrees F). Do not freeze or shake. Throw away any unused portion if using a single-dose vial. Multi-dose vials can be kept in the refrigerator for up to 21 days after the initial dose. Throw away unused medicine. NOTE: This sheet is a summary. It may not cover all possible information. If you have questions about this medicine, talk to your doctor, pharmacist, or health care provider.  2012, [employer]. (05/27/2008 10:25:44 AM)

## 2011-12-20 LAB — TYPE AND SCREEN

## 2012-01-03 ENCOUNTER — Encounter (HOSPITAL_COMMUNITY)
Admission: RE | Admit: 2012-01-03 | Discharge: 2012-01-03 | Disposition: A | Discharge status: Home / Self Care | Payer: Medicare Other | Source: Ambulatory Visit | Attending: Internal Medicine | Admitting: Internal Medicine

## 2012-01-03 ENCOUNTER — Encounter (HOSPITAL_COMMUNITY): Payer: Self-pay

## 2012-01-03 DIAGNOSIS — D649 Anemia, unspecified: Secondary | ICD-10-CM | POA: Insufficient documentation

## 2012-01-03 LAB — CBC
HCT: 29.3 % — ABNORMAL LOW (ref 39.0–52.0)
MCH: 23.7 pg — ABNORMAL LOW (ref 26.0–34.0)
MCHC: 31.1 g/dL (ref 30.0–36.0)
MCV: 76.3 fL — ABNORMAL LOW (ref 78.0–100.0)
Platelets: 283 10*3/uL (ref 150–400)
RDW: 15.8 % — ABNORMAL HIGH (ref 11.5–15.5)
WBC: 5.9 10*3/uL (ref 4.0–10.5)

## 2012-01-03 MED ORDER — DARBEPOETIN ALFA-POLYSORBATE 100 MCG/0.5ML IJ SOLN: 60.0000 ug | INTRAMUSCULAR | Status: DC

## 2012-01-03 MED ORDER — DARBEPOETIN ALFA-POLYSORBATE 100 MCG/0.5ML IJ SOLN
INTRAMUSCULAR | Status: AC
  Filled 2012-01-03: qty 0.5

## 2012-01-03 MED ORDER — DARBEPOETIN ALFA-POLYSORBATE 100 MCG/0.5ML IJ SOLN
60.0000 ug | INTRAMUSCULAR | Status: DC
  Administered 2012-01-03: 60 ug via SUBCUTANEOUS
  Filled 2012-01-03: qty 0.5

## 2012-01-09 ENCOUNTER — Encounter: Payer: Self-pay | Admitting: Internal Medicine

## 2012-01-09 ENCOUNTER — Ambulatory Visit (INDEPENDENT_AMBULATORY_CARE_PROVIDER_SITE_OTHER): Payer: Medicare Other | Admitting: *Deleted

## 2012-01-09 DIAGNOSIS — I495 Sick sinus syndrome: Secondary | ICD-10-CM

## 2012-01-09 NOTE — Progress Notes (Signed)
PPM check 

## 2012-01-10 LAB — PACEMAKER DEVICE OBSERVATION
AL AMPLITUDE: 4 mv
AL IMPEDENCE PM: 422 Ohm
BAMS-0001: 150 {beats}/min
BATTERY VOLTAGE: 2.8 V
RV LEAD AMPLITUDE: 11.2 mv
RV LEAD IMPEDENCE PM: 485 Ohm
VENTRICULAR PACING PM: 1

## 2012-01-17 ENCOUNTER — Encounter (HOSPITAL_COMMUNITY)
Admission: RE | Admit: 2012-01-17 | Discharge: 2012-01-17 | Disposition: A | Discharge status: Home / Self Care | Payer: Medicare Other | Source: Ambulatory Visit | Attending: Internal Medicine | Admitting: Internal Medicine

## 2012-01-17 ENCOUNTER — Encounter (HOSPITAL_COMMUNITY): Payer: Self-pay

## 2012-01-17 LAB — CBC
HCT: 28.1 % — ABNORMAL LOW (ref 39.0–52.0)
Hemoglobin: 8.6 g/dL — ABNORMAL LOW (ref 13.0–17.0)
MCH: 22.8 pg — ABNORMAL LOW (ref 26.0–34.0)
MCHC: 30.6 g/dL (ref 30.0–36.0)
RBC: 3.78 MIL/uL — ABNORMAL LOW (ref 4.22–5.81)

## 2012-01-17 MED ORDER — DARBEPOETIN ALFA-POLYSORBATE 100 MCG/0.5ML IJ SOLN
60.0000 ug | INTRAMUSCULAR | Status: DC
  Administered 2012-01-17: 60 ug via SUBCUTANEOUS
  Filled 2012-01-17: qty 0.5

## 2012-01-31 ENCOUNTER — Encounter (HOSPITAL_COMMUNITY): Payer: Self-pay

## 2012-01-31 ENCOUNTER — Encounter (HOSPITAL_COMMUNITY)
Admission: RE | Admit: 2012-01-31 | Discharge: 2012-01-31 | Disposition: A | Discharge status: Home / Self Care | Payer: Medicare Other | Source: Ambulatory Visit | Attending: Internal Medicine | Admitting: Internal Medicine

## 2012-01-31 DIAGNOSIS — D649 Anemia, unspecified: Secondary | ICD-10-CM | POA: Insufficient documentation

## 2012-01-31 LAB — CBC
HCT: 28.8 % — ABNORMAL LOW (ref 39.0–52.0)
MCH: 22 pg — ABNORMAL LOW (ref 26.0–34.0)
MCV: 73.7 fL — ABNORMAL LOW (ref 78.0–100.0)
RDW: 16.2 % — ABNORMAL HIGH (ref 11.5–15.5)
WBC: 6.7 10*3/uL (ref 4.0–10.5)

## 2012-01-31 MED ORDER — DARBEPOETIN ALFA-POLYSORBATE 100 MCG/0.5ML IJ SOLN
60.0000 ug | INTRAMUSCULAR | Status: DC
  Administered 2012-01-31: 60 ug via SUBCUTANEOUS
  Filled 2012-01-31: qty 0.5

## 2012-01-31 MED ORDER — DARBEPOETIN ALFA-POLYSORBATE 100 MCG/0.5ML IJ SOLN
60.0000 ug | INTRAMUSCULAR | Status: DC
  Filled 2012-01-31: qty 0.5

## 2012-01-31 MED ORDER — DARBEPOETIN ALFA-POLYSORBATE 100 MCG/0.5ML IJ SOLN: 60.0000 ug | Freq: Once | INTRAMUSCULAR | Status: DC

## 2012-02-08 ENCOUNTER — Other Ambulatory Visit: Payer: Self-pay | Admitting: Internal Medicine

## 2012-02-14 ENCOUNTER — Encounter (HOSPITAL_COMMUNITY): Payer: Self-pay

## 2012-02-14 ENCOUNTER — Encounter (HOSPITAL_COMMUNITY)
Admission: RE | Admit: 2012-02-14 | Discharge: 2012-02-14 | Disposition: A | Discharge status: Home / Self Care | Payer: Medicare Other | Source: Ambulatory Visit | Attending: Internal Medicine | Admitting: Internal Medicine

## 2012-02-14 LAB — CBC
HCT: 30.3 % — ABNORMAL LOW (ref 39.0–52.0)
Hemoglobin: 9.2 g/dL — ABNORMAL LOW (ref 13.0–17.0)
MCH: 22 pg — ABNORMAL LOW (ref 26.0–34.0)
MCHC: 30.4 g/dL (ref 30.0–36.0)
RDW: 16.6 % — ABNORMAL HIGH (ref 11.5–15.5)

## 2012-02-14 MED ORDER — DARBEPOETIN ALFA-POLYSORBATE 100 MCG/0.5ML IJ SOLN
60.0000 ug | INTRAMUSCULAR | Status: DC
  Administered 2012-02-14: 60 ug via SUBCUTANEOUS
  Filled 2012-02-14: qty 0.5

## 2012-02-28 ENCOUNTER — Encounter (HOSPITAL_COMMUNITY)
Admission: RE | Admit: 2012-02-28 | Discharge: 2012-02-28 | Disposition: A | Discharge status: Home / Self Care | Payer: Medicare Other | Source: Ambulatory Visit | Attending: Internal Medicine | Admitting: Internal Medicine

## 2012-02-28 ENCOUNTER — Encounter (HOSPITAL_COMMUNITY): Payer: Self-pay

## 2012-02-28 DIAGNOSIS — D649 Anemia, unspecified: Secondary | ICD-10-CM | POA: Insufficient documentation

## 2012-02-28 LAB — CBC
MCH: 21.2 pg — ABNORMAL LOW (ref 26.0–34.0)
MCV: 70.9 fL — ABNORMAL LOW (ref 78.0–100.0)
Platelets: 303 10*3/uL (ref 150–400)
RDW: 16.7 % — ABNORMAL HIGH (ref 11.5–15.5)
WBC: 6.3 10*3/uL (ref 4.0–10.5)

## 2012-02-28 MED ORDER — DARBEPOETIN ALFA-POLYSORBATE 100 MCG/0.5ML IJ SOLN
60.0000 ug | INTRAMUSCULAR | Status: DC
  Administered 2012-02-28: 60 ug via SUBCUTANEOUS
  Filled 2012-02-28: qty 0.5

## 2012-03-13 ENCOUNTER — Encounter (HOSPITAL_COMMUNITY)
Admission: RE | Admit: 2012-03-13 | Discharge: 2012-03-13 | Disposition: A | Discharge status: Home / Self Care | Payer: Medicare Other | Source: Ambulatory Visit | Attending: Internal Medicine | Admitting: Internal Medicine

## 2012-03-13 ENCOUNTER — Encounter (HOSPITAL_COMMUNITY): Payer: Self-pay

## 2012-03-13 LAB — CBC
MCHC: 29.9 g/dL — ABNORMAL LOW (ref 30.0–36.0)
Platelets: 302 10*3/uL (ref 150–400)
RDW: 16.8 % — ABNORMAL HIGH (ref 11.5–15.5)
WBC: 6.2 10*3/uL (ref 4.0–10.5)

## 2012-03-13 MED ORDER — DARBEPOETIN ALFA-POLYSORBATE 100 MCG/0.5ML IJ SOLN
60.0000 ug | INTRAMUSCULAR | Status: DC
  Administered 2012-03-13: 60 ug via SUBCUTANEOUS
  Filled 2012-03-13 (×2): qty 0.5

## 2012-03-26 ENCOUNTER — Other Ambulatory Visit: Payer: Self-pay | Admitting: Internal Medicine

## 2012-03-26 ENCOUNTER — Other Ambulatory Visit (HOSPITAL_COMMUNITY): Payer: Self-pay | Admitting: *Deleted

## 2012-03-26 MED ORDER — DARBEPOETIN ALFA-POLYSORBATE 60 MCG/ML IJ SOLN: 60.0000 ug | INTRAMUSCULAR | Status: DC

## 2012-03-27 ENCOUNTER — Encounter (HOSPITAL_COMMUNITY)
Admission: RE | Admit: 2012-03-27 | Discharge: 2012-03-27 | Disposition: A | Discharge status: Home / Self Care | Payer: Medicare Other | Source: Ambulatory Visit | Attending: Internal Medicine | Admitting: Internal Medicine

## 2012-03-27 ENCOUNTER — Encounter (HOSPITAL_COMMUNITY): Payer: Self-pay

## 2012-03-27 DIAGNOSIS — D649 Anemia, unspecified: Secondary | ICD-10-CM | POA: Insufficient documentation

## 2012-03-27 LAB — CBC
Hemoglobin: 8.8 g/dL — ABNORMAL LOW (ref 13.0–17.0)
Platelets: 300 10*3/uL (ref 150–400)
RBC: 4.27 MIL/uL (ref 4.22–5.81)
WBC: 6.4 10*3/uL (ref 4.0–10.5)

## 2012-03-27 MED ORDER — DARBEPOETIN ALFA-POLYSORBATE 60 MCG/0.3ML IJ SOLN
60.0000 ug | INTRAMUSCULAR | Status: DC
  Administered 2012-03-27: 60 ug via INTRAVENOUS
  Filled 2012-03-27: qty 0.3

## 2012-04-02 IMAGING — CR DG CHEST 1V PORT
2 series · 2 of 2 positions shown · non-contrast
Comparison: Chest radiograph 07/06/2010

CLINICAL DATA: Chest pain

PORTABLE CHEST - 1 VIEW

[view not recorded (1 of 2)]
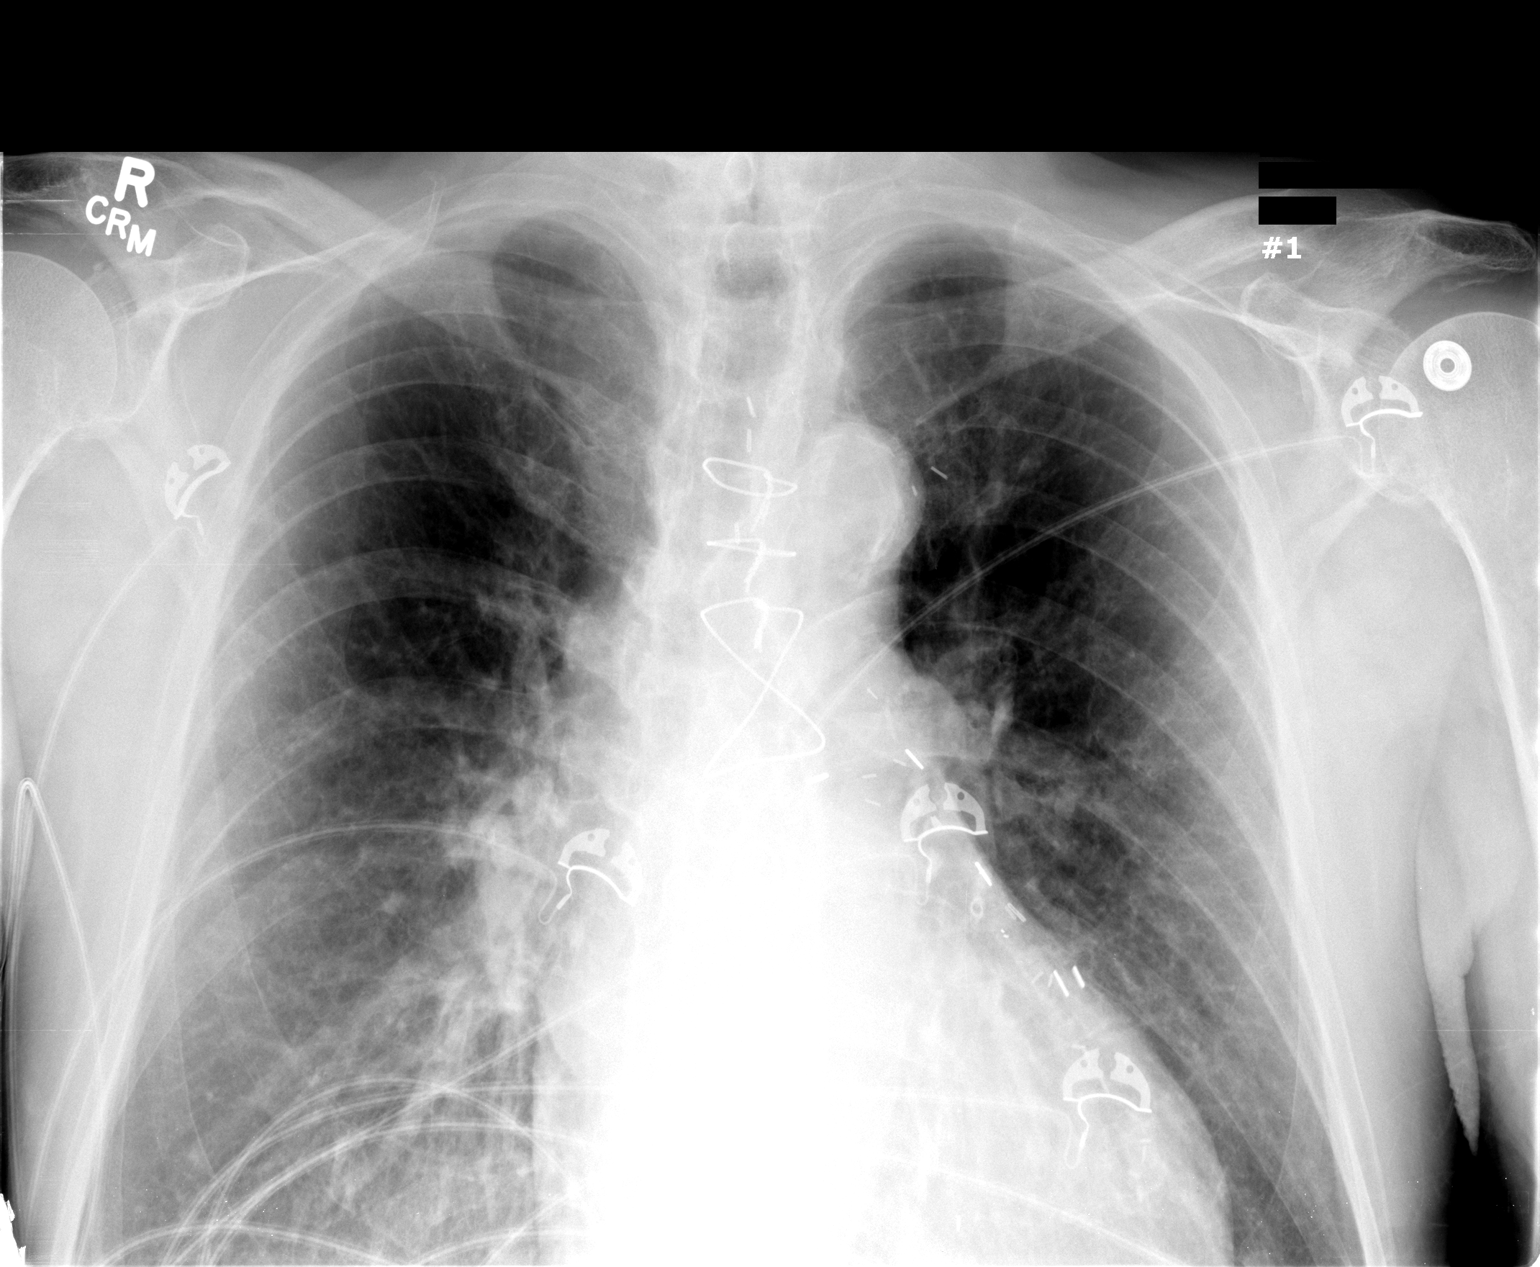

[view not recorded (2 of 2)]
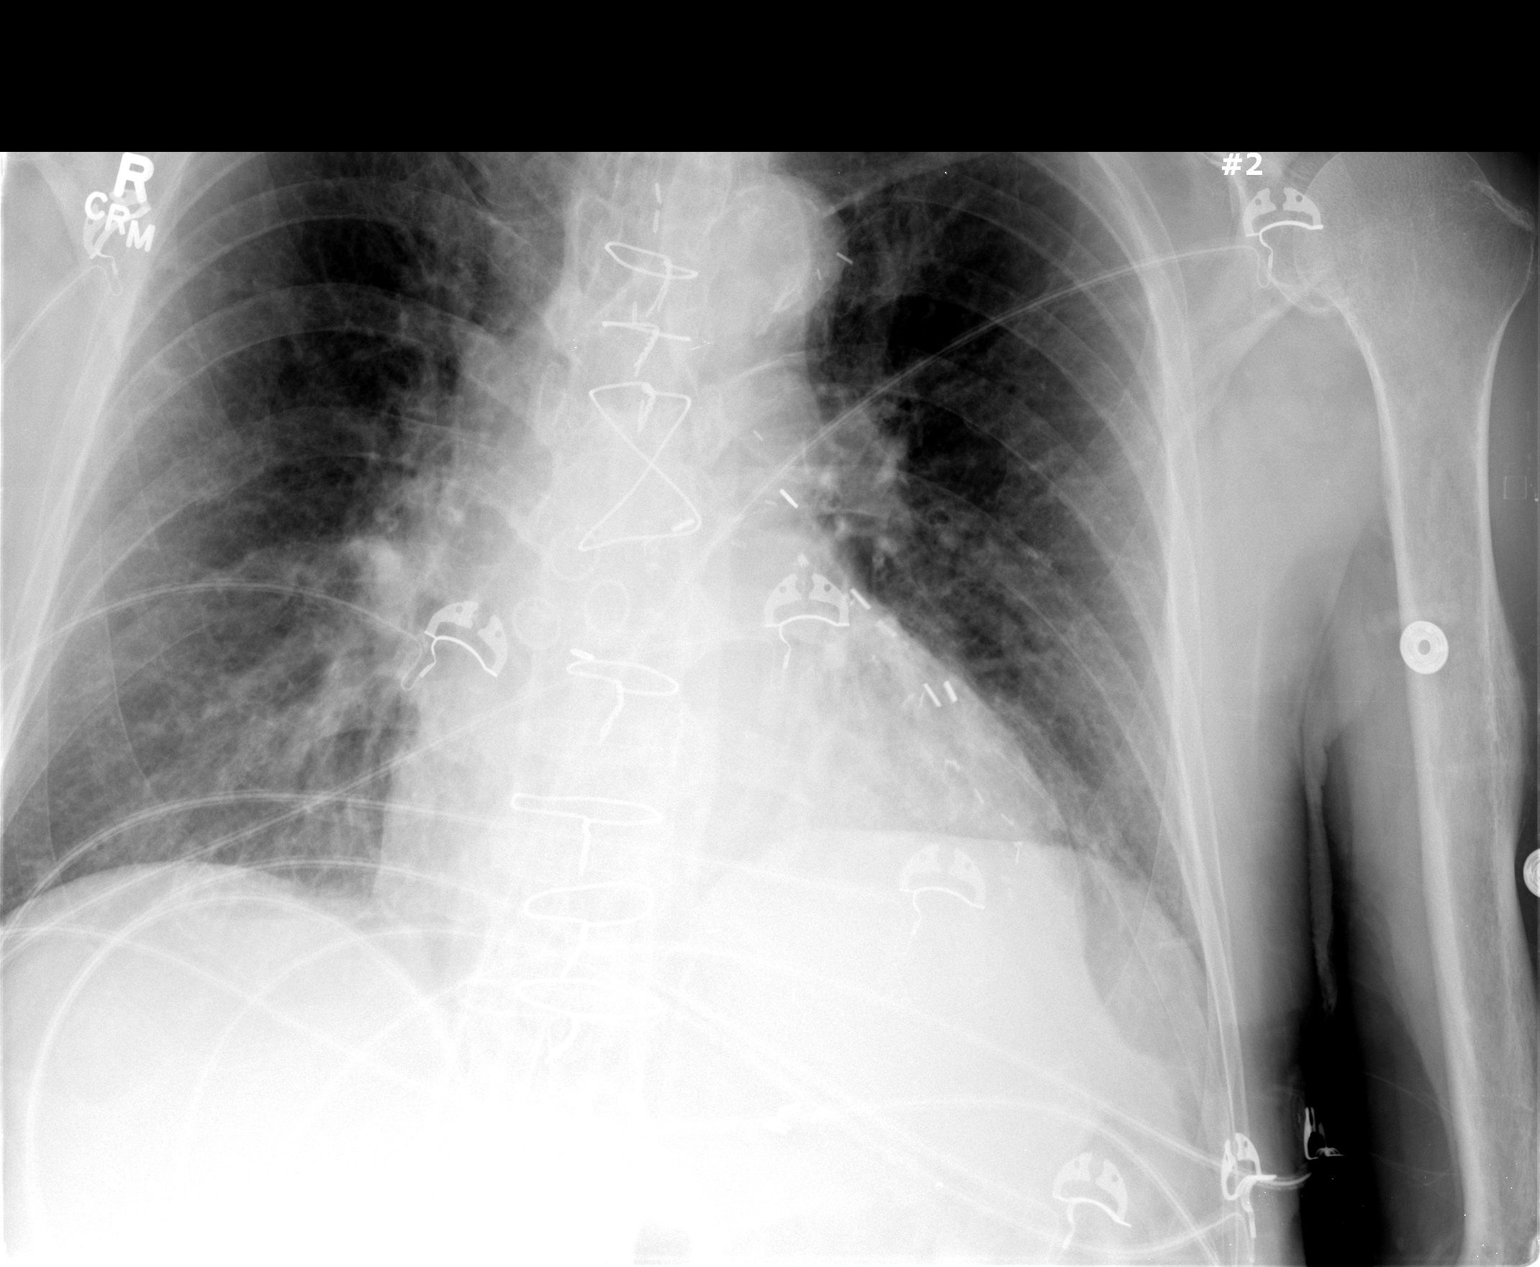

[2 of 2 positions shown; findings below may reference images not displayed]

FINDINGS: Sternotomy wires overlies normal cardiac silhouette.
Costophrenic angles are clear.  No evidence effusion, infiltrate,
or pneumothorax.  Central venous pulmonary congestion.  Mild
basilar atelectasis.
IMPRESSION: Central venous congestion and mild basilar
atelectasis.

## 2012-04-04 IMAGING — CR DG CHEST 1V PORT
1 series · 1 of 1 positions shown · non-contrast
Comparison: 07/06/2010

CLINICAL DATA: Atrial fib.

PORTABLE CHEST - 1 VIEW

[view not recorded]
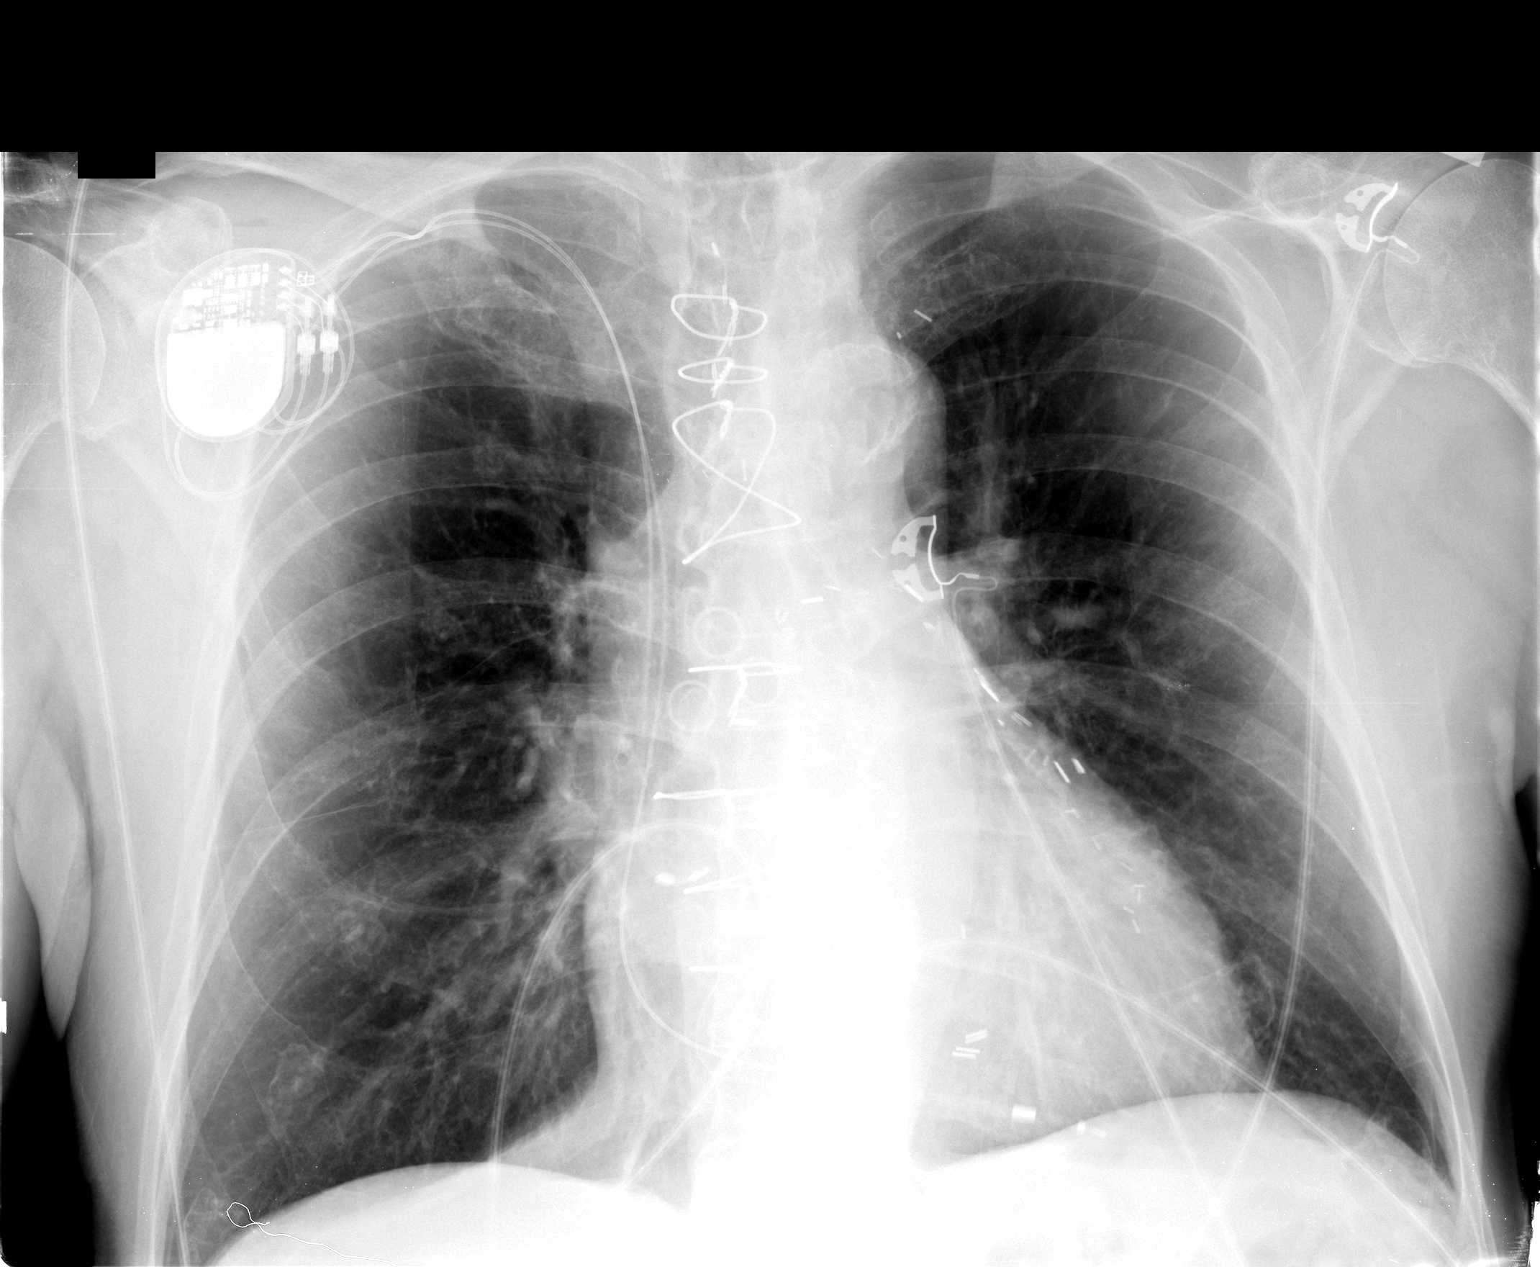

[1 of 1 positions shown; findings below may reference images not displayed]

FINDINGS: Right pacer has been placed with leads in the right
atrium and right ventricle.  No pneumothorax.  Mild hyperinflation
of the lungs.  Prior CABG.  Heart is upper limits normal in size.
No confluent opacity or effusion.
IMPRESSION: Right pacer placement without pneumothorax.

COPD.

No active disease.

## 2012-04-10 ENCOUNTER — Encounter (HOSPITAL_COMMUNITY): Payer: Self-pay

## 2012-04-10 ENCOUNTER — Encounter (HOSPITAL_COMMUNITY)
Admission: RE | Admit: 2012-04-10 | Discharge: 2012-04-10 | Disposition: A | Discharge status: Home / Self Care | Payer: Medicare Other | Source: Ambulatory Visit | Attending: Internal Medicine | Admitting: Internal Medicine

## 2012-04-10 LAB — CBC
HCT: 32.9 % — ABNORMAL LOW (ref 39.0–52.0)
Hemoglobin: 9.9 g/dL — ABNORMAL LOW (ref 13.0–17.0)
RBC: 4.69 MIL/uL (ref 4.22–5.81)
RDW: 17.8 % — ABNORMAL HIGH (ref 11.5–15.5)
WBC: 7.1 10*3/uL (ref 4.0–10.5)

## 2012-04-10 MED ORDER — DARBEPOETIN ALFA-POLYSORBATE 60 MCG/0.3ML IJ SOLN
60.0000 ug | INTRAMUSCULAR | Status: DC
  Administered 2012-04-10: 60 ug via INTRAVENOUS
  Filled 2012-04-10: qty 0.3

## 2012-04-24 ENCOUNTER — Encounter (HOSPITAL_COMMUNITY)
Admission: RE | Admit: 2012-04-24 | Discharge: 2012-04-24 | Disposition: A | Discharge status: Home / Self Care | Payer: Medicare Other | Source: Ambulatory Visit | Attending: Internal Medicine | Admitting: Internal Medicine

## 2012-04-24 ENCOUNTER — Encounter (HOSPITAL_COMMUNITY): Payer: Self-pay

## 2012-04-24 LAB — CBC
HCT: 32.8 % — ABNORMAL LOW (ref 39.0–52.0)
Hemoglobin: 9.8 g/dL — ABNORMAL LOW (ref 13.0–17.0)
MCH: 20.9 pg — ABNORMAL LOW (ref 26.0–34.0)
MCV: 69.9 fL — ABNORMAL LOW (ref 78.0–100.0)
RBC: 4.69 MIL/uL (ref 4.22–5.81)

## 2012-04-24 MED ORDER — DARBEPOETIN ALFA-POLYSORBATE 60 MCG/0.3ML IJ SOLN
60.0000 ug | INTRAMUSCULAR | Status: DC
  Administered 2012-04-24: 60 ug via INTRAVENOUS
  Filled 2012-04-24: qty 0.3

## 2012-04-24 NOTE — Discharge Instructions (Signed)
Anemia, Nonspecific  Your exam and blood tests show you are anemic. This means your blood (hemoglobin) level is low. Normal hemoglobin values are 12 to 15 g/dL for females and 14 to 17 g/dL for males. Make a note of your hemoglobin level today. The hematocrit percent is also used to measure anemia. A normal hematocrit is 38% to 46% in females and 42% to 49% in males. Make a note of your hematocrit level today.  CAUSES   Anemia can be due to many different causes.   Excessive bleeding from periods (in women).   Intestinal bleeding.   Poor nutrition.   Kidney, thyroid, liver, and bone marrow diseases.  SYMPTOMS   Anemia can come on suddenly (acute). It can also come on slowly. Symptoms can include:   Minor weakness.   Dizziness.   Palpitations.   Shortness of breath.  Symptoms may be absent until half your hemoglobin is missing if it comes on slowly. Anemia due to acute blood loss from an injury or internal bleeding may require blood transfusion if the loss is severe. Hospital care is needed if you are anemic and there is significant continual blood loss.  TREATMENT    Stool tests for blood (Hemoccult) and additional lab tests are often needed. This determines the best treatment.   Further checking on your condition and your response to treatment is very important. It often takes many weeks to correct anemia.  Depending on the cause, treatment can include:   Supplements of iron.   Vitamins B12 and folic acid.   Hormone medicines.If your anemia is due to bleeding, finding the cause of the blood loss is very important. This will help avoid further problems.  SEEK IMMEDIATE MEDICAL CARE IF:    You develop fainting, extreme weakness, shortness of breath, or chest pain.   You develop heavy vaginal bleeding.   You develop bloody or black, tarry stools or vomit up blood.   You develop a high fever, rash, repeated vomiting, or dehydration.  Document Released: 07/21/2004 Document Revised: 09/05/2011 Document  Reviewed: 04/28/2009  [employer] Patient Information 2013 [employer].

## 2012-05-02 ENCOUNTER — Other Ambulatory Visit: Payer: Self-pay | Admitting: Cardiovascular Disease

## 2012-05-02 NOTE — Telephone Encounter (Signed)
Pt needs appointment then refill can be made Fax Received. Refill Completed. Steven Callahan (R.M.A)   

## 2012-05-08 ENCOUNTER — Encounter (HOSPITAL_COMMUNITY): Payer: Self-pay

## 2012-05-08 ENCOUNTER — Encounter (HOSPITAL_COMMUNITY)
Admission: RE | Admit: 2012-05-08 | Discharge: 2012-05-08 | Disposition: A | Discharge status: Home / Self Care | Payer: Medicare Other | Source: Ambulatory Visit | Attending: Internal Medicine | Admitting: Internal Medicine

## 2012-05-08 DIAGNOSIS — D649 Anemia, unspecified: Secondary | ICD-10-CM | POA: Insufficient documentation

## 2012-05-08 LAB — CBC
MCH: 21 pg — ABNORMAL LOW (ref 26.0–34.0)
Platelets: 288 10*3/uL (ref 150–400)
RBC: 4.57 MIL/uL (ref 4.22–5.81)
WBC: 6.7 10*3/uL (ref 4.0–10.5)

## 2012-05-08 MED ORDER — DARBEPOETIN ALFA-POLYSORBATE 60 MCG/0.3ML IJ SOLN
60.0000 ug | INTRAMUSCULAR | Status: DC
  Administered 2012-05-08: 60 ug via INTRAVENOUS
  Filled 2012-05-08: qty 0.3

## 2012-05-17 ENCOUNTER — Other Ambulatory Visit (HOSPITAL_COMMUNITY): Payer: Self-pay | Admitting: Internal Medicine

## 2012-05-17 ENCOUNTER — Other Ambulatory Visit (HOSPITAL_COMMUNITY): Payer: Self-pay | Admitting: *Deleted

## 2012-05-22 ENCOUNTER — Encounter (HOSPITAL_COMMUNITY)
Admission: RE | Admit: 2012-05-22 | Discharge: 2012-05-22 | Disposition: A | Discharge status: Home / Self Care | Payer: Medicare Other | Source: Ambulatory Visit | Attending: Internal Medicine | Admitting: Internal Medicine

## 2012-05-22 ENCOUNTER — Encounter (HOSPITAL_COMMUNITY): Payer: Self-pay

## 2012-05-22 LAB — CBC
HCT: 34 % — ABNORMAL LOW (ref 39.0–52.0)
RDW: 17.7 % — ABNORMAL HIGH (ref 11.5–15.5)
WBC: 6.9 10*3/uL (ref 4.0–10.5)

## 2012-05-22 MED ORDER — DARBEPOETIN ALFA-POLYSORBATE 60 MCG/0.3ML IJ SOLN: 60.0000 ug | INTRAMUSCULAR | Status: DC

## 2012-05-25 ENCOUNTER — Ambulatory Visit: Payer: Medicare Other | Admitting: Neurosurgery

## 2012-05-25 ENCOUNTER — Other Ambulatory Visit: Payer: Medicare Other

## 2012-05-30 ENCOUNTER — Encounter: Payer: Self-pay | Admitting: Neurosurgery

## 2012-05-31 ENCOUNTER — Ambulatory Visit (INDEPENDENT_AMBULATORY_CARE_PROVIDER_SITE_OTHER): Payer: Medicare Other | Admitting: Neurosurgery

## 2012-05-31 ENCOUNTER — Other Ambulatory Visit (INDEPENDENT_AMBULATORY_CARE_PROVIDER_SITE_OTHER): Payer: Medicare Other | Admitting: *Deleted

## 2012-05-31 ENCOUNTER — Encounter: Payer: Self-pay | Admitting: Neurosurgery

## 2012-05-31 VITALS — BP 178/62 | HR 84 | Ht 72.0 in | Wt 171.0 lb

## 2012-05-31 DIAGNOSIS — I6529 Occlusion and stenosis of unspecified carotid artery: Secondary | ICD-10-CM

## 2012-05-31 NOTE — Progress Notes (Signed)
[facility] Carotid Office Note  CC: Carotid surveillance Referring Physician: Edilia Bo  History of Present Illness: 78 year old male patient of Dr. Adele Dan with a known left ICA occlusion. The patient denies any signs or symptoms of CVA, TIA, amaurosis fugax or any neural deficit.  Past Medical History  Diagnosis Date  . Murmur   . 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 HTN (hypertension)   . Carotid artery disease     TOTALLY OCCLUDED LEFT CAROTID  . Coronary artery disease   . Myocardial infarction 1981  . Atrial fibrillation   . GERD (gastroesophageal reflux disease)   . Carotid stenosis   . Anemia     ROS: [x]  Positive   [ ]  Denies    General: [ ]  Weight loss, [ ]  Fever, [ ]  chills Neurologic: [ ]  Dizziness, [ ]  Blackouts, [ ]  Seizure [ ]  Stroke, [ ]  "Mini stroke", [ ]  Slurred speech, [ ]  Temporary blindness; [ ]  weakness in arms or legs, [ ]  Hoarseness Cardiac: [ ]  Chest pain/pressure, [ ]  Shortness of breath at rest [ ]  Shortness of breath with exertion, [ ]  Atrial fibrillation or irregular heartbeat Vascular: [ ]  Pain in legs with walking, [ ]  Pain in legs at rest, [ ]  Pain in legs at night,  [ ]  Non-healing ulcer, [ ]  Blood clot in vein/DVT,   Pulmonary: [ ]  Home oxygen, [ ]  Productive cough, [ ]  Coughing up blood, [ ]  Asthma,  [ ]  Wheezing Musculoskeletal:  [ ]  Arthritis, [ ]  Low back pain, [ ]  Joint pain Hematologic: [ ]  Easy Bruising, [ ]  Anemia; [ ]  Hepatitis Gastrointestinal: [ ]  Blood in stool, [ ]  Gastroesophageal Reflux/heartburn, [ ]  Trouble swallowing Urinary: [ ]  chronic Kidney disease, [ ]  on HD - [ ]  MWF or [ ]  TTHS, [ ]  Burning with urination, [ ]  Difficulty urinating Skin: [ ]  Rashes, [ ]  Wounds Psychological: [ ]  Anxiety, [ ]  Depression   Social History History  Substance Use Topics  . Smoking status: Former Smoker -- 1.0 packs/day    Types: Cigarettes    Quit date: 09/05/1980  .  Smokeless tobacco: Never Used  . Alcohol Use: No    Family History Family History  Problem Relation Age of Onset  . Stroke Father   . Bone cancer Mother   . Coronary artery disease Child   . Coronary artery disease Child     Allergies  Allergen Reactions  . Ibuprofen Hives    Current Outpatient Prescriptions  Medication Sig Dispense Refill  . ALPRAZolam (XANAX) 0.5 MG tablet Take 0.5 mg by mouth at bedtime.        Marland Kitchen amLODipine (NORVASC) 10 MG tablet Take 1 tablet (10 mg total) by mouth daily.  30 tablet  11  . aspirin 81 MG tablet Take 81 mg by mouth daily.        . bisacodyl (DULCOLAX) 5 MG EC tablet Take 5 mg by mouth daily as needed.        . darbepoetin alfa-polysorbate (ARANESP, ALBUMIN FREE,) 60 MCG/ML injection Inject 1 mL (60 mcg total) into the skin every 14 (fourteen) days.  0.42 mL  11  . hydrALAZINE (APRESOLINE) 25 MG tablet Take 50 mg by mouth 3 (three) times daily as needed.       . hydrochlorothiazide (HYDRODIURIL) 25 MG tablet TAKE 1 TABLET BY MOUTH DAILY  30 tablet  3  . isosorbide mononitrate (  IMDUR) 60 MG 24 hr tablet Take 60 mg by mouth daily.        . Linagliptin (TRADJENTA) 5 MG TABS Take 5 mg by mouth daily.        . metoprolol succinate (TOPROL-XL) 50 MG 24 hr tablet Take 1 tablet (50 mg total) by mouth daily.  30 tablet  11  . traMADol (ULTRAM) 50 MG tablet Take 50 mg by mouth every 8 (eight) hours as needed.      Carlena Hurl 15 MG TABS tablet TAKE 1 TABLET (15 MG TOTAL) BY MOUTH DAILY.  30 tablet  6  . nitroGLYCERIN (NITROSTAT) 0.4 MG SL tablet Place 1 tablet (0.4 mg total) under the tongue every 5 (five) minutes as needed.  25 tablet  5    Physical Examination  Filed Vitals:   05/31/12 1334  BP: 178/62  Pulse:     Body mass index is 23.19 kg/(m^2).  General:  WDWN in NAD Gait: Normal HEENT: WNL Eyes: Pupils equal Pulmonary: normal non-labored breathing , without Rales, rhonchi,  wheezing Cardiac: RRR, without  Murmurs, rubs or  gallops; Abdomen: soft, NT, no masses Skin: no rashes, ulcers noted  Vascular Exam Pulses: 3+ radial pulses bilaterally Carotid bruits: Mild right sided bruit heard, known left occlusion Extremities without ischemic changes, no Gangrene , no cellulitis; no open wounds;  Musculoskeletal: no muscle wasting or atrophy   Neurologic: A&O X 3; Appropriate Affect ; SENSATION: normal; MOTOR FUNCTION:  moving all extremities equally. Speech is fluent/normal  Non-Invasive Vascular Imaging CAROTID DUPLEX 05/31/2012  Right ICA 40 - 59 % stenosis Left ICA 0ccluded stenosis   ASSESSMENT/PLAN: Asymptomatic patient with unchanged exam from previous duplex one year ago. The patient will followup in one year with repeat carotid duplex, his questions were encouraged and answered, he is in agreement with this plan.  Lauree Chandler ANP   Clinic MD: Darrick Penna

## 2012-06-01 NOTE — Addendum Note (Signed)
Addended by: Sharee Pimple on: 06/01/2012 08:17 AM   Modules accepted: Orders

## 2012-06-05 ENCOUNTER — Encounter (HOSPITAL_COMMUNITY): Payer: Self-pay

## 2012-06-05 ENCOUNTER — Encounter (HOSPITAL_COMMUNITY)
Admission: RE | Admit: 2012-06-05 | Discharge: 2012-06-05 | Disposition: A | Discharge status: Home / Self Care | Payer: Medicare Other | Source: Ambulatory Visit | Attending: Internal Medicine | Admitting: Internal Medicine

## 2012-06-05 DIAGNOSIS — D649 Anemia, unspecified: Secondary | ICD-10-CM | POA: Insufficient documentation

## 2012-06-05 LAB — CBC
Hemoglobin: 10.1 g/dL — ABNORMAL LOW (ref 13.0–17.0)
MCH: 21.7 pg — ABNORMAL LOW (ref 26.0–34.0)
RBC: 4.66 MIL/uL (ref 4.22–5.81)
WBC: 7.6 10*3/uL (ref 4.0–10.5)

## 2012-06-05 NOTE — Progress Notes (Signed)
aranesp not required today  Se labs and criteria

## 2012-06-18 ENCOUNTER — Other Ambulatory Visit (HOSPITAL_COMMUNITY): Payer: Medicare Other

## 2012-06-22 ENCOUNTER — Inpatient Hospital Stay (HOSPITAL_COMMUNITY): Admission: RE | Admit: 2012-06-22 | Payer: Medicare Other | Source: Ambulatory Visit

## 2012-07-02 ENCOUNTER — Encounter: Payer: Self-pay | Admitting: Internal Medicine

## 2012-07-02 ENCOUNTER — Ambulatory Visit (INDEPENDENT_AMBULATORY_CARE_PROVIDER_SITE_OTHER): Payer: Medicare Other | Admitting: Internal Medicine

## 2012-07-02 VITALS — BP 154/42 | HR 72 | Ht 72.0 in | Wt 168.0 lb

## 2012-07-02 DIAGNOSIS — Z950 Presence of cardiac pacemaker: Secondary | ICD-10-CM

## 2012-07-02 DIAGNOSIS — I2510 Atherosclerotic heart disease of native coronary artery without angina pectoris: Secondary | ICD-10-CM

## 2012-07-02 DIAGNOSIS — I10 Essential (primary) hypertension: Secondary | ICD-10-CM

## 2012-07-02 DIAGNOSIS — I495 Sick sinus syndrome: Secondary | ICD-10-CM

## 2012-07-02 DIAGNOSIS — I4891 Unspecified atrial fibrillation: Secondary | ICD-10-CM

## 2012-07-02 LAB — PACEMAKER DEVICE OBSERVATION
AL IMPEDENCE PM: 440 Ohm
AL THRESHOLD: 0.75 V
ATRIAL PACING PM: 39.4
BAMS-0001: 150 {beats}/min
RV LEAD THRESHOLD: 0.5 V

## 2012-07-02 NOTE — Assessment & Plan Note (Signed)
Stable No change required today  

## 2012-07-02 NOTE — Addendum Note (Signed)
Addended by: Dennis Bast F on: 07/02/2012 12:17 PM   Modules accepted: Orders

## 2012-07-02 NOTE — Progress Notes (Signed)
PCP:  Gwen Pounds, MD Primary Cardiologist:  Dr Elease Hashimoto  The patient presents today for routine electrophysiology followup.  Since last being seen in our clinic, the patient reports doing reasonably well.  He remains active for his age.  His primary concern is with anemia.  He has been placed on arinesp. Today, he denies symptoms of palpitations, shortness of breath, lower extremity edema, dizziness, presyncope, or syncope.  The patient feels that he is tolerating medications without difficulties and is otherwise without complaint today.   Past Medical History  Diagnosis Date  . Murmur   . 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 HTN (hypertension)   . Carotid artery disease     TOTALLY OCCLUDED LEFT CAROTID  . Coronary artery disease   . CAD (coronary artery disease) 1981  . Atrial fibrillation   . GERD (gastroesophageal reflux disease)   . Anemia     on arinesp  . AAA (abdominal aortic aneurysm)    Past Surgical History  Procedure Date  . Nasal septum surgery   . Cataract extraction, bilateral   . Coronary artery bypass graft 1999  . Pacemaker insertion 07/08/10    MDT pacemaker by Dr Deborah Chalk for tachy/brady syndrome    Current Outpatient Prescriptions  Medication Sig Dispense Refill  . ALPRAZolam (XANAX) 0.5 MG tablet Take 0.5 mg by mouth at bedtime.        Marland Kitchen amLODipine (NORVASC) 10 MG tablet Take 1 tablet (10 mg total) by mouth daily.  30 tablet  11  . aspirin 81 MG tablet Take 81 mg by mouth daily.        . bisacodyl (DULCOLAX) 5 MG EC tablet Take 5 mg by mouth daily as needed.        . darbepoetin alfa-polysorbate (ARANESP, ALBUMIN FREE,) 60 MCG/ML injection Inject 1 mL (60 mcg total) into the skin every 14 (fourteen) days.  0.42 mL  11  . hydrALAZINE (APRESOLINE) 25 MG tablet Take 50 mg by mouth 3 (three) times daily as needed.       . hydrochlorothiazide (HYDRODIURIL) 25 MG tablet TAKE 1 TABLET BY MOUTH DAILY  30 tablet  3  . isosorbide  mononitrate (IMDUR) 60 MG 24 hr tablet Take 60 mg by mouth daily.        . Linagliptin (TRADJENTA) 5 MG TABS Take 5 mg by mouth daily.        . metoprolol succinate (TOPROL-XL) 50 MG 24 hr tablet Take 1 tablet (50 mg total) by mouth daily.  30 tablet  11  . nitroGLYCERIN (NITROSTAT) 0.4 MG SL tablet Place 1 tablet (0.4 mg total) under the tongue every 5 (five) minutes as needed.  25 tablet  5  . traMADol (ULTRAM) 50 MG tablet Take 50 mg by mouth every 8 (eight) hours as needed.      Carlena Hurl 15 MG TABS tablet TAKE 1 TABLET (15 MG TOTAL) BY MOUTH DAILY.  30 tablet  6    Allergies  Allergen Reactions  . Ibuprofen Hives    History   Social History  . Marital Status: Married    Spouse Name: N/A    Number of Children: N/A  . Years of Education: N/A   Occupational History  . Not on file.   Social History Main Topics  . Smoking status: Former Smoker -- 1.0 packs/day    Types: Cigarettes    Quit date: 09/05/1980  . Smokeless tobacco: Never Used  . Alcohol Use:  No  . Drug Use: No  . Sexually Active: Not on file   Other Topics Concern  . Not on file   Social History Narrative  . No narrative on file    Family History  Problem Relation Age of Onset  . Stroke Father   . Bone cancer Mother   . Coronary artery disease Child   . Coronary artery disease Child    Physical Exam: Filed Vitals:   07/02/12 1146  BP: 154/42  Pulse: 72  Height: 6' (1.829 m)  Weight: 168 lb (76.204 kg)  SpO2: 98%    GEN- The patient is well appearing, alert and oriented x 3 today.   Head- normocephalic, atraumatic Eyes-  Sclera clear, conjunctiva pink Ears- hearing intact Oropharynx- clear Neck- supple, + bilateral bruits Lymph- no cervical lymphadenopathy Lungs- Clear to ausculation bilaterally, normal work of breathing Chest- pacemaker pocket is well healed Heart- Regular rate and rhythm, 2/6 early SEM LUB, 2/6 diastolic murmuc LSB GI- soft, NT, ND, + BS Extremities- no clubbing,  cyanosis, or edema   Pacemaker interrogation- reviewed in detail today,  See PACEART report  Assessment and Plan:

## 2012-07-02 NOTE — Patient Instructions (Addendum)
Your physician wants you to follow-up in: 6 months with device clinic/Lori NP and 12 months with Dr Johney Frame.  You will receive a reminder letter in the mail two months in advance. If you don't receive a letter, please call our office to schedule the follow-up appointment.  Your physician has recommended you make the following change in your medication: Stop Aspirin.

## 2012-07-02 NOTE — Assessment & Plan Note (Signed)
Stable No change required today  Follow-up with Lori/ Dr Elease Hashimoto in 6 months I will see in the device clinic in a year

## 2012-07-02 NOTE — Assessment & Plan Note (Signed)
No afib since last interrogation Continue xarelto Stop ASA given difficulty with anemia

## 2012-07-02 NOTE — Assessment & Plan Note (Signed)
Normal pacemaker function See Pace Art report No changes today  

## 2012-07-09 ENCOUNTER — Encounter: Payer: Self-pay | Admitting: Internal Medicine

## 2012-07-10 ENCOUNTER — Encounter (HOSPITAL_COMMUNITY)
Admission: RE | Admit: 2012-07-10 | Discharge: 2012-07-10 | Disposition: A | Discharge status: Home / Self Care | Payer: Medicare Other | Source: Ambulatory Visit | Attending: Internal Medicine | Admitting: Internal Medicine

## 2012-07-10 ENCOUNTER — Encounter (HOSPITAL_COMMUNITY): Payer: Self-pay

## 2012-07-10 DIAGNOSIS — D649 Anemia, unspecified: Secondary | ICD-10-CM | POA: Insufficient documentation

## 2012-07-10 LAB — CBC
MCHC: 30.6 g/dL (ref 30.0–36.0)
Platelets: 238 10*3/uL (ref 150–400)
RDW: 20.3 % — ABNORMAL HIGH (ref 11.5–15.5)
WBC: 5.6 10*3/uL (ref 4.0–10.5)

## 2012-07-19 ENCOUNTER — Other Ambulatory Visit: Payer: Self-pay | Admitting: Internal Medicine

## 2012-07-23 IMAGING — CR DG CHEST 1V PORT
1 series · 1 of 1 positions shown · non-contrast
Comparison: Portable chest x-ray of 07/08/2010

CLINICAL DATA: Except, new admission

PORTABLE CHEST - 1 VIEW

[AP]
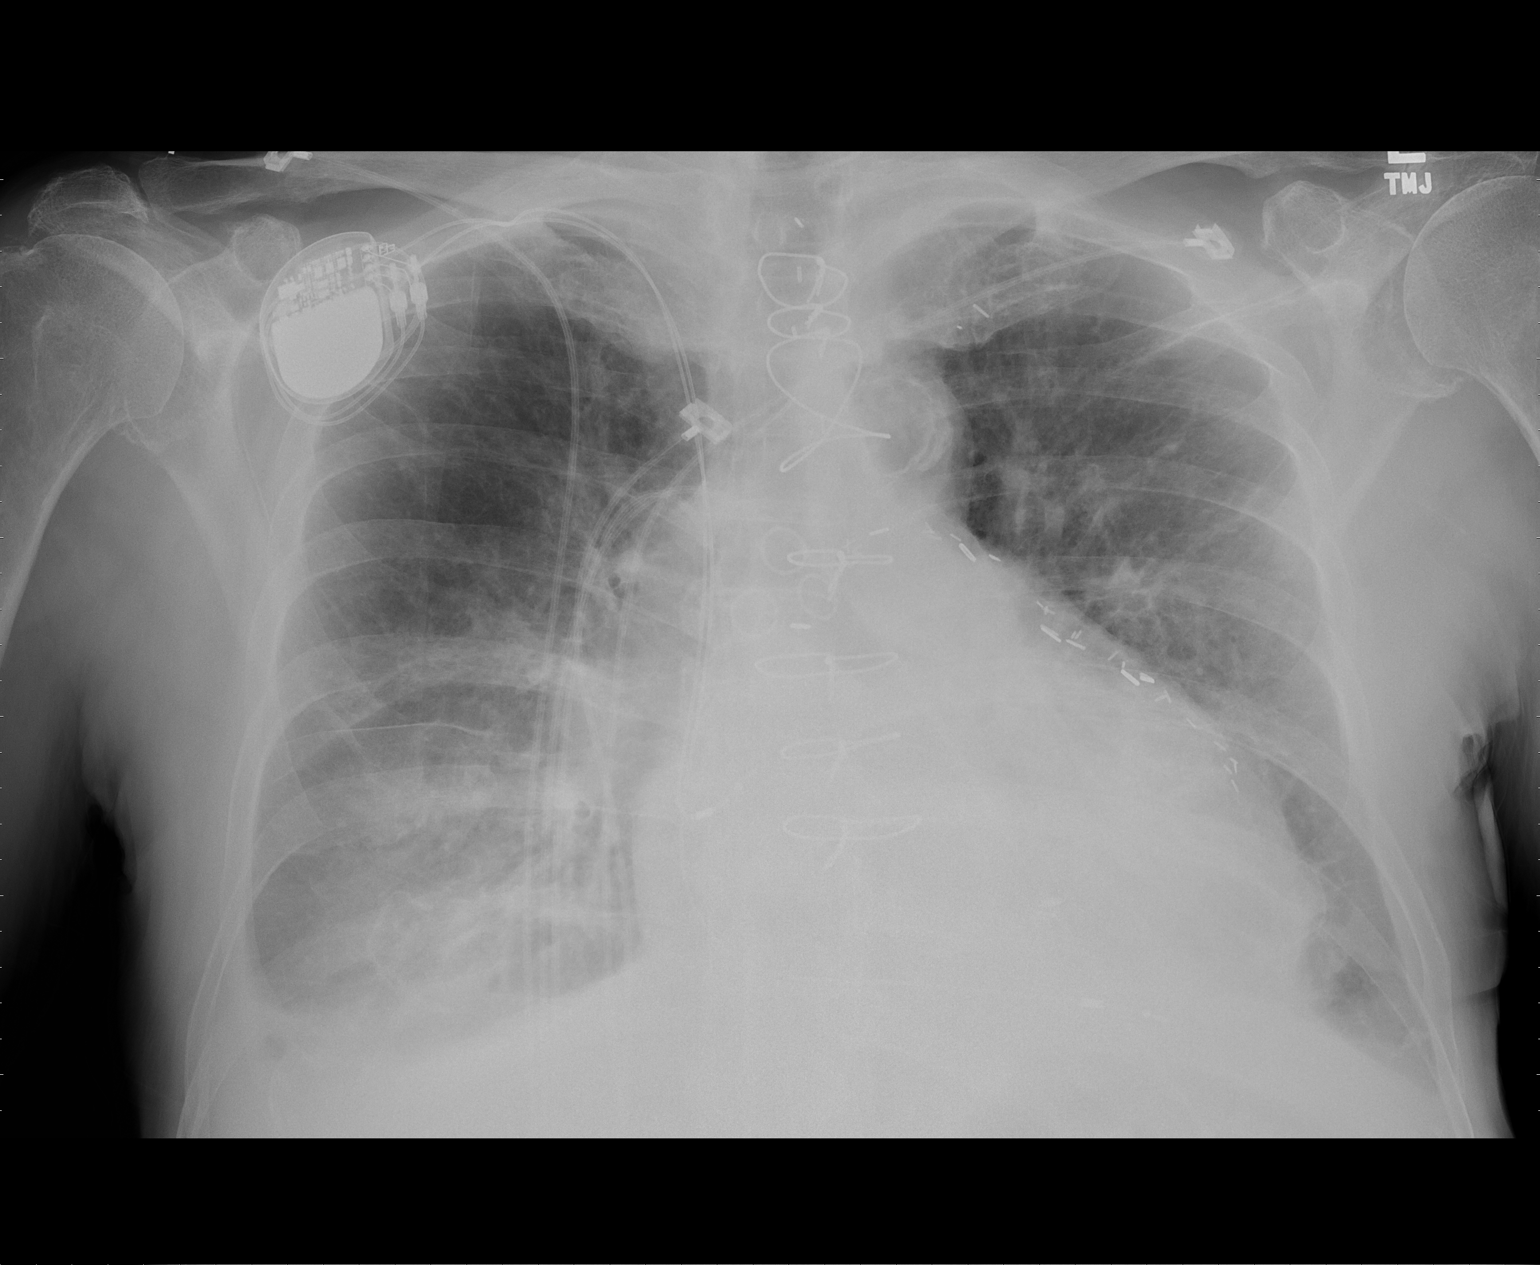

[1 of 1 positions shown; findings below may reference images not displayed]

FINDINGS: There is cardiomegaly present with probable pulmonary
vascular congestion and effusions with basilar atelectasis.
Pneumonia at the lung bases cannot be excluded.  Permanent
pacemaker remains.
IMPRESSION: Cardiomegaly and probable pulmonary vascular congestion with
effusions.

## 2012-07-24 ENCOUNTER — Inpatient Hospital Stay (HOSPITAL_COMMUNITY): Admission: RE | Admit: 2012-07-24 | Payer: Medicare Other | Source: Ambulatory Visit

## 2012-07-24 IMAGING — CR DG CHEST 2V
2 series · 2 of 2 positions shown · non-contrast
Comparison: 10/26/2010.

CLINICAL DATA: CHF and shortness of breath.

CHEST - 2 VIEW

[w chest pa]
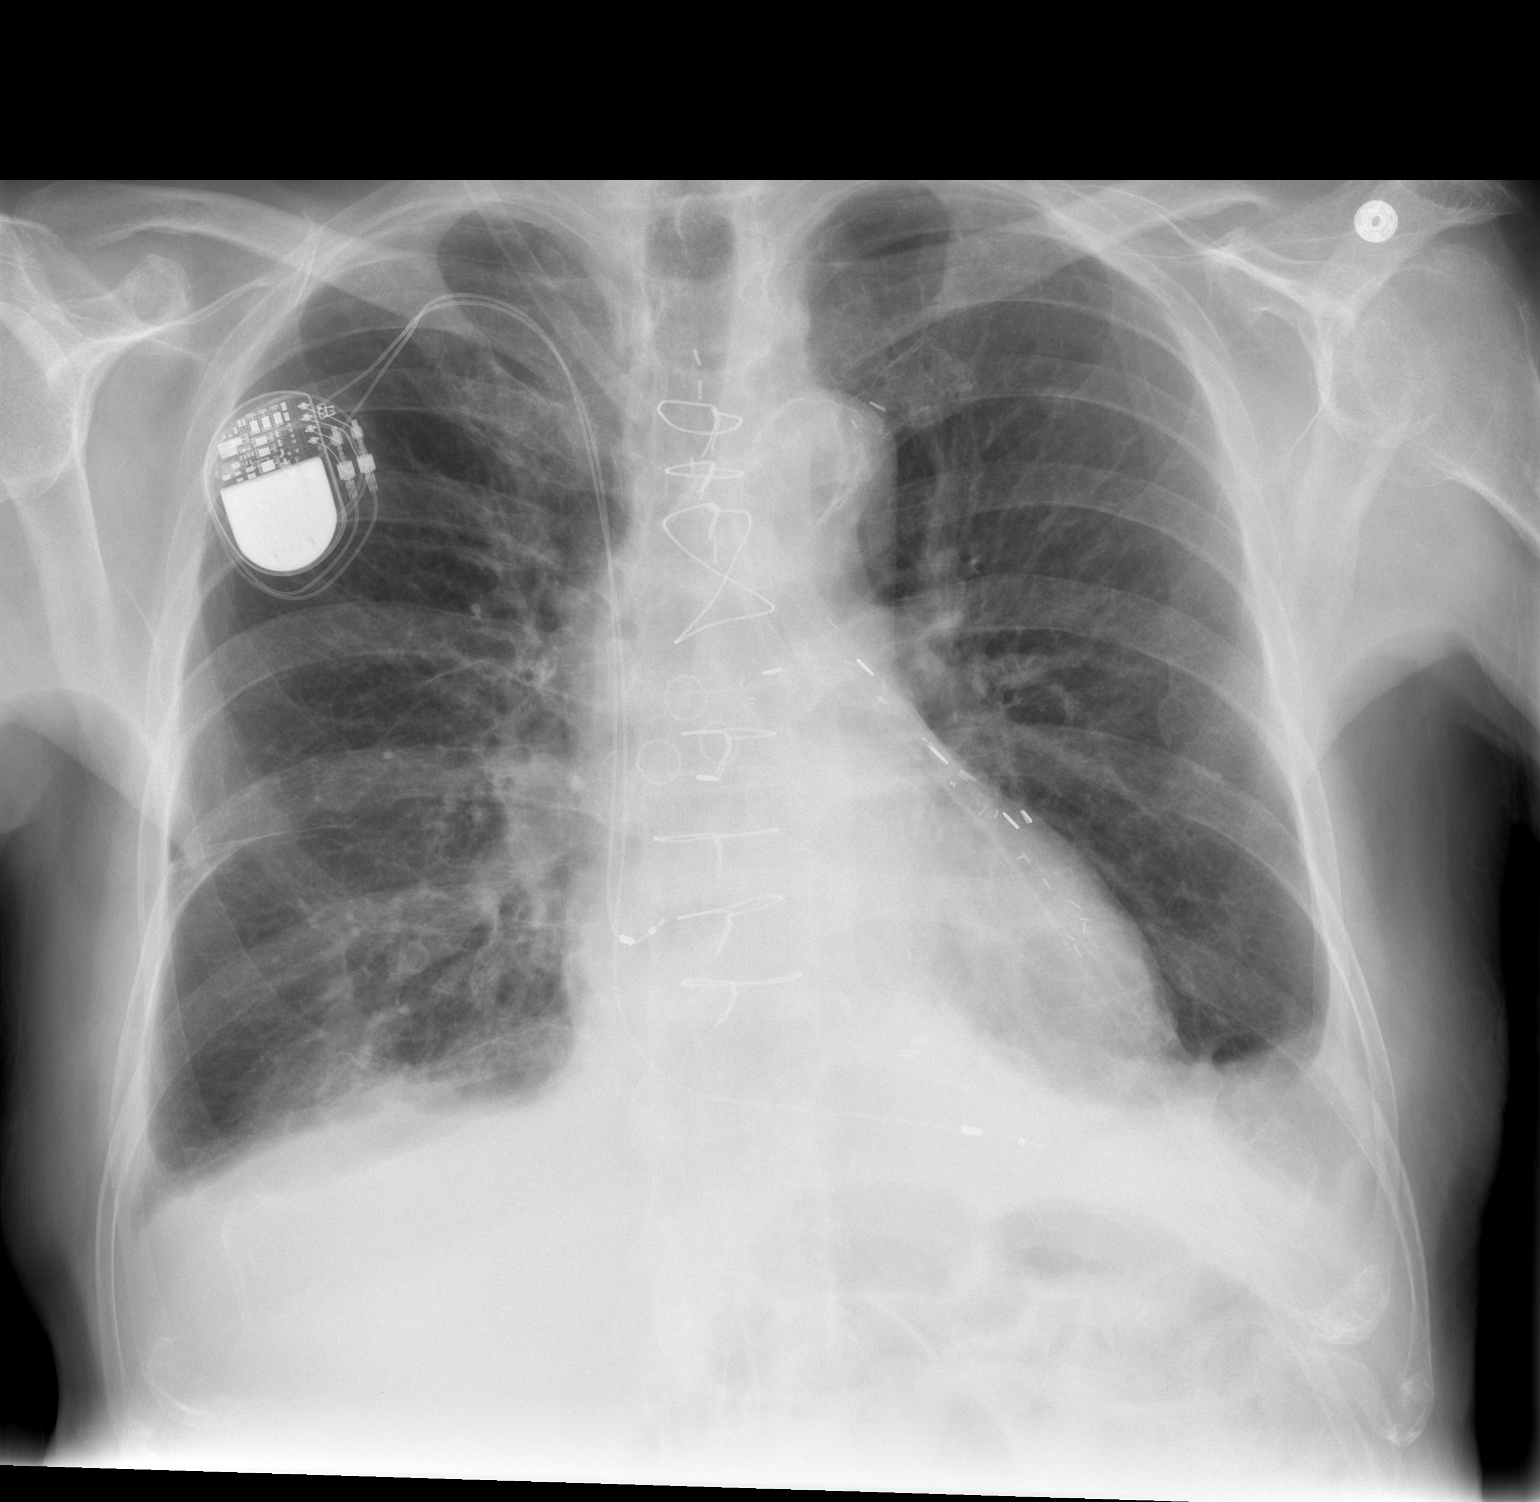

[w chest lat]
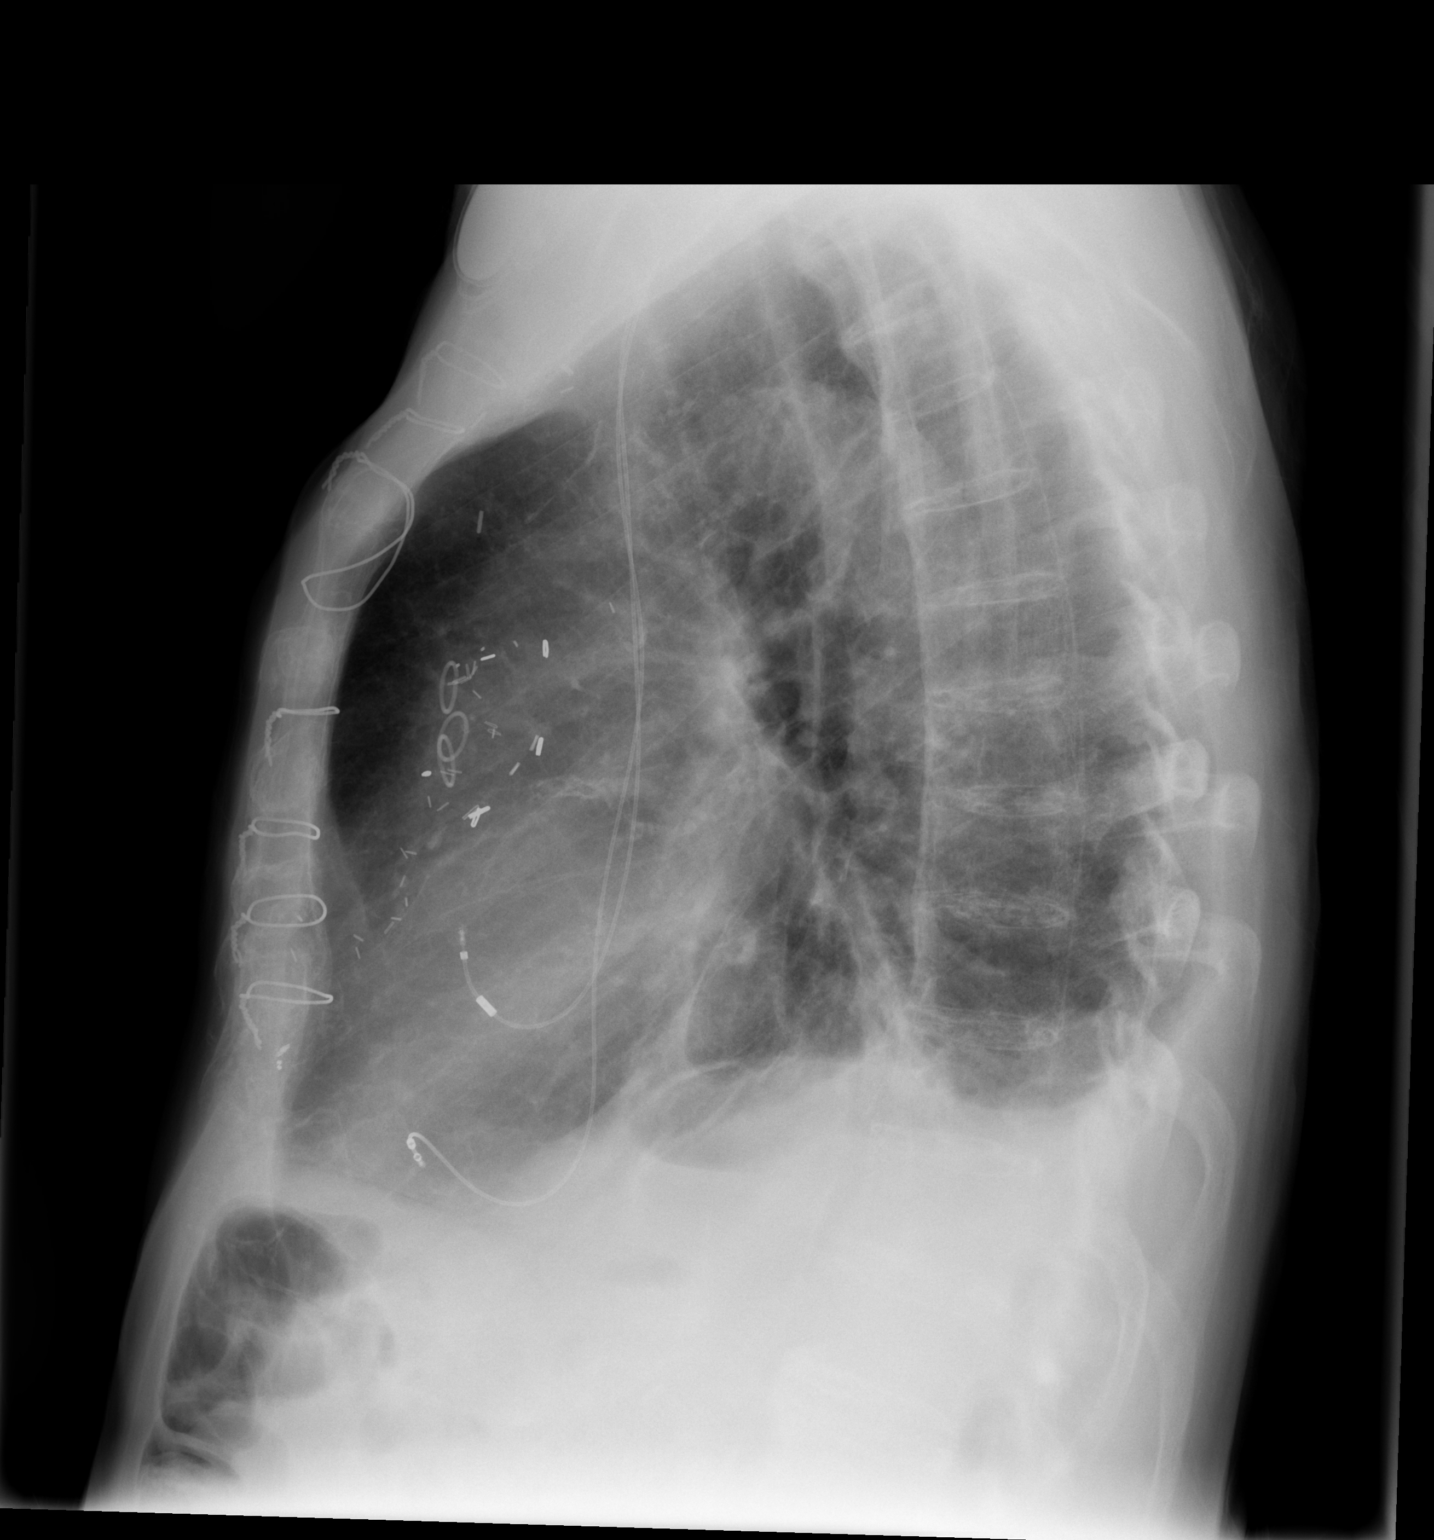

[2 of 2 positions shown; findings below may reference images not displayed]

FINDINGS: The lungs are better aerated than on yesterday's film.
There is some trace residual basilar atelectasis with tiny a small
bilateral pleural effusions.  No evidence for pulmonary edema. The
cardiopericardial silhouette is within normal limits for size.
Dual lead right-sided permanent pacemaker remains in place.
Radiopaque tubing overlies the right supraclavicular region and is
presumed to be external to the patient.
IMPRESSION: Interval improvement in lung aeration with some residual trace
basilar atelectasis and tiny to small bilateral pleural effusions.

## 2012-07-25 ENCOUNTER — Other Ambulatory Visit: Payer: Self-pay

## 2012-07-25 MED ORDER — METOPROLOL SUCCINATE ER 50 MG PO TB24: 50.0000 mg | ORAL_TABLET | Freq: Every day | ORAL | Status: DC

## 2012-08-03 ENCOUNTER — Other Ambulatory Visit: Payer: Self-pay | Admitting: *Deleted

## 2012-08-03 DIAGNOSIS — I701 Atherosclerosis of renal artery: Secondary | ICD-10-CM

## 2012-08-03 DIAGNOSIS — I10 Essential (primary) hypertension: Secondary | ICD-10-CM

## 2012-08-03 DIAGNOSIS — I4891 Unspecified atrial fibrillation: Secondary | ICD-10-CM

## 2012-08-03 MED ORDER — AMLODIPINE BESYLATE 10 MG PO TABS: 10.0000 mg | ORAL_TABLET | Freq: Every day | ORAL | Status: DC

## 2012-08-03 NOTE — Telephone Encounter (Signed)
NEED APPOINTMENT Fax Received. Refill Completed. Steven Callahan (R.M.A)   

## 2012-08-03 NOTE — Telephone Encounter (Signed)
Opened in Error.

## 2012-08-07 ENCOUNTER — Encounter (HOSPITAL_COMMUNITY): Payer: Self-pay

## 2012-08-07 ENCOUNTER — Encounter (HOSPITAL_COMMUNITY)
Admission: RE | Admit: 2012-08-07 | Discharge: 2012-08-07 | Disposition: A | Discharge status: Home / Self Care | Payer: Medicare Other | Source: Ambulatory Visit | Attending: Internal Medicine | Admitting: Internal Medicine

## 2012-08-07 DIAGNOSIS — D649 Anemia, unspecified: Secondary | ICD-10-CM | POA: Insufficient documentation

## 2012-08-07 LAB — CBC
Hemoglobin: 11.2 g/dL — ABNORMAL LOW (ref 13.0–17.0)
Platelets: 240 10*3/uL (ref 150–400)
RBC: 4.69 MIL/uL (ref 4.22–5.81)
WBC: 7.7 10*3/uL (ref 4.0–10.5)

## 2012-08-31 ENCOUNTER — Other Ambulatory Visit (HOSPITAL_COMMUNITY): Payer: Self-pay | Admitting: Internal Medicine

## 2012-09-04 ENCOUNTER — Encounter (HOSPITAL_COMMUNITY)
Admission: RE | Admit: 2012-09-04 | Discharge: 2012-09-04 | Disposition: A | Discharge status: Home / Self Care | Payer: Medicare Other | Source: Ambulatory Visit | Attending: Internal Medicine | Admitting: Internal Medicine

## 2012-09-04 ENCOUNTER — Encounter (HOSPITAL_COMMUNITY): Payer: Self-pay

## 2012-09-04 DIAGNOSIS — D649 Anemia, unspecified: Secondary | ICD-10-CM | POA: Insufficient documentation

## 2012-09-04 LAB — CBC
HCT: 34 % — ABNORMAL LOW (ref 39.0–52.0)
MCV: 78.5 fL (ref 78.0–100.0)
RBC: 4.33 MIL/uL (ref 4.22–5.81)
WBC: 6.2 10*3/uL (ref 4.0–10.5)

## 2012-09-04 MED ORDER — DARBEPOETIN ALFA-POLYSORBATE 60 MCG/0.3ML IJ SOLN: 60.0000 ug | INTRAMUSCULAR | Status: DC

## 2012-09-10 ENCOUNTER — Other Ambulatory Visit: Payer: Self-pay | Admitting: Emergency Medicine

## 2012-09-10 MED ORDER — RIVAROXABAN 15 MG PO TABS: ORAL_TABLET | ORAL | Status: DC

## 2012-09-27 ENCOUNTER — Other Ambulatory Visit: Payer: Self-pay | Admitting: *Deleted

## 2012-09-27 DIAGNOSIS — I10 Essential (primary) hypertension: Secondary | ICD-10-CM

## 2012-09-27 DIAGNOSIS — I4891 Unspecified atrial fibrillation: Secondary | ICD-10-CM

## 2012-09-27 DIAGNOSIS — I701 Atherosclerosis of renal artery: Secondary | ICD-10-CM

## 2012-09-27 MED ORDER — AMLODIPINE BESYLATE 10 MG PO TABS: 10.0000 mg | ORAL_TABLET | Freq: Every day | ORAL | Status: DC

## 2012-10-04 ENCOUNTER — Encounter (HOSPITAL_COMMUNITY): Admission: RE | Admit: 2012-10-04 | Payer: Medicare Other | Source: Ambulatory Visit

## 2012-10-17 ENCOUNTER — Encounter (HOSPITAL_COMMUNITY)
Admission: RE | Admit: 2012-10-17 | Discharge: 2012-10-17 | Disposition: A | Discharge status: Home / Self Care | Payer: Medicare Other | Source: Ambulatory Visit | Attending: Internal Medicine | Admitting: Internal Medicine

## 2012-10-17 ENCOUNTER — Encounter (HOSPITAL_COMMUNITY): Payer: Self-pay

## 2012-10-17 DIAGNOSIS — D649 Anemia, unspecified: Secondary | ICD-10-CM | POA: Insufficient documentation

## 2012-10-17 LAB — CBC
MCH: 26 pg (ref 26.0–34.0)
MCHC: 32.2 g/dL (ref 30.0–36.0)
MCV: 80.8 fL (ref 78.0–100.0)
Platelets: 228 10*3/uL (ref 150–400)
RBC: 4.27 MIL/uL (ref 4.22–5.81)

## 2012-10-17 MED ORDER — DARBEPOETIN ALFA-POLYSORBATE 60 MCG/0.3ML IJ SOLN: 60.0000 ug | INTRAMUSCULAR | Status: DC

## 2012-11-14 ENCOUNTER — Encounter (HOSPITAL_COMMUNITY)
Admission: RE | Admit: 2012-11-14 | Discharge: 2012-11-14 | Disposition: A | Discharge status: Home / Self Care | Payer: Medicare Other | Source: Ambulatory Visit | Attending: Internal Medicine | Admitting: Internal Medicine

## 2012-11-14 ENCOUNTER — Encounter (HOSPITAL_COMMUNITY): Payer: Self-pay

## 2012-11-14 VITALS — BP 173/38 | HR 64 | Temp 97.9°F | Resp 18 | Ht 72.0 in | Wt 170.0 lb

## 2012-11-14 DIAGNOSIS — D649 Anemia, unspecified: Secondary | ICD-10-CM | POA: Insufficient documentation

## 2012-11-14 LAB — CBC
HCT: 34.8 % — ABNORMAL LOW (ref 39.0–52.0)
MCHC: 32.2 g/dL (ref 30.0–36.0)
MCV: 82.1 fL (ref 78.0–100.0)
Platelets: 226 10*3/uL (ref 150–400)
RDW: 15.4 % (ref 11.5–15.5)
WBC: 6.5 10*3/uL (ref 4.0–10.5)

## 2012-11-14 NOTE — Progress Notes (Signed)
Aranesp not given today, CBC 11.2., Lab results faxed to office.

## 2012-12-12 ENCOUNTER — Encounter (HOSPITAL_COMMUNITY): Payer: Self-pay

## 2012-12-12 ENCOUNTER — Encounter (HOSPITAL_COMMUNITY)
Admission: RE | Admit: 2012-12-12 | Discharge: 2012-12-12 | Disposition: A | Discharge status: Home / Self Care | Payer: Medicare Other | Source: Ambulatory Visit | Attending: Internal Medicine | Admitting: Internal Medicine

## 2012-12-12 VITALS — BP 176/54 | HR 82 | Temp 97.8°F | Resp 18 | Ht 72.0 in | Wt 170.0 lb

## 2012-12-12 DIAGNOSIS — D649 Anemia, unspecified: Secondary | ICD-10-CM

## 2012-12-12 LAB — CBC
Hemoglobin: 11.3 g/dL — ABNORMAL LOW (ref 13.0–17.0)
MCH: 26.2 pg (ref 26.0–34.0)
MCHC: 32.1 g/dL (ref 30.0–36.0)
Platelets: 224 10*3/uL (ref 150–400)
RBC: 4.31 MIL/uL (ref 4.22–5.81)

## 2012-12-12 NOTE — Progress Notes (Signed)
HGB 11.3, Aranesp  Not given today. Results faxed to office.

## 2013-01-08 ENCOUNTER — Ambulatory Visit (INDEPENDENT_AMBULATORY_CARE_PROVIDER_SITE_OTHER): Payer: Medicare Other | Admitting: *Deleted

## 2013-01-08 ENCOUNTER — Encounter: Payer: Self-pay | Admitting: Nurse Practitioner

## 2013-01-08 ENCOUNTER — Ambulatory Visit (INDEPENDENT_AMBULATORY_CARE_PROVIDER_SITE_OTHER): Payer: Medicare Other | Admitting: Nurse Practitioner

## 2013-01-08 VITALS — BP 168/38 | HR 66 | Ht 72.0 in | Wt 169.4 lb

## 2013-01-08 DIAGNOSIS — I4891 Unspecified atrial fibrillation: Secondary | ICD-10-CM

## 2013-01-08 DIAGNOSIS — I495 Sick sinus syndrome: Secondary | ICD-10-CM

## 2013-01-08 DIAGNOSIS — I10 Essential (primary) hypertension: Secondary | ICD-10-CM

## 2013-01-08 DIAGNOSIS — I2510 Atherosclerotic heart disease of native coronary artery without angina pectoris: Secondary | ICD-10-CM

## 2013-01-08 LAB — PACEMAKER DEVICE OBSERVATION
AL IMPEDENCE PM: 440 Ohm
AL THRESHOLD: 1 V
RV LEAD AMPLITUDE: 15.68 mv
RV LEAD IMPEDENCE PM: 475 Ohm
RV LEAD THRESHOLD: 0.5 V

## 2013-01-08 NOTE — Progress Notes (Signed)
PPM check in office. 

## 2013-01-08 NOTE — Patient Instructions (Signed)
Stay on your current regimen.  I will see you in 6 months.  Stay safe  Call the [facility] office at [phone] if you have any questions, problems or concerns.

## 2013-01-08 NOTE — Progress Notes (Signed)
Steven Callahan Date of Birth: Mar 06, 1921 Medical Record #[id number]  History of Present Illness: Steven Callahan is seen back today for his 6 month check. Seen for Dr. Aretta Nip. He is now 79 years of age, soon to be 42. He has CAD, remote CABG, DM, HTN, anemia (on Aranesp), aortic insufficiency and paroxysmal atrial fib. He has a pacemaker for tachybrady. He is on chronic Xarelto. Has a known left ICA occlusion and is followed by [facility]. Has not tolerated amiodarone in the past. Followed by Dr. Timothy Lasso in primary care.   Aspirin was stopped at his last visit here in January due to issues with his anemia.   Comes back today. He is here with his wife, Steven Callahan. Doing well. Remains very active. Still mowing and doing yard work. Still using his chainsaw and getting on his roof to clean out gutters. Not dizzy or lightheaded. No passing out. No chest pain. Says is breathing is fine. Does not wish to have his repeat renal duplex or abdominal ultrasound. He has opted for conservative management. BP is 160's at home and labile. They use Hydralazine prn.    Current Outpatient Prescriptions  Medication Sig Dispense Refill  . ALPRAZolam (XANAX) 0.5 MG tablet Take 0.5 mg by mouth at bedtime.        Marland Kitchen amLODipine (NORVASC) 10 MG tablet Take 1 tablet (10 mg total) by mouth daily.  30 tablet  6  . bisacodyl (DULCOLAX) 5 MG EC tablet Take 5 mg by mouth daily as needed.        . hydrALAZINE (APRESOLINE) 25 MG tablet Take 50 mg by mouth 3 (three) times daily as needed.       . hydrochlorothiazide (HYDRODIURIL) 25 MG tablet TAKE 1 TABLET BY MOUTH DAILY  30 tablet  3  . isosorbide mononitrate (IMDUR) 60 MG 24 hr tablet Take 60 mg by mouth daily.        . Linagliptin (TRADJENTA) 5 MG TABS Take 5 mg by mouth daily.        . metoprolol succinate (TOPROL-XL) 50 MG 24 hr tablet Take 1 tablet (50 mg total) by mouth daily.  30 tablet  11  . nitroGLYCERIN (NITROSTAT) 0.4 MG SL tablet Place 1 tablet (0.4 mg total) under the tongue every  5 (five) minutes as needed.  25 tablet  5  . Rivaroxaban (XARELTO) 15 MG TABS tablet TAKE 1 TABLET (15 MG TOTAL) BY MOUTH DAILY.  30 tablet  6  . traMADol (ULTRAM) 50 MG tablet Take 50 mg by mouth every 8 (eight) hours as needed.       No current facility-administered medications for this visit.    Allergies  Allergen Reactions  . Ibuprofen Hives    Past Medical History  Diagnosis Date  . Murmur   . 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 HTN (hypertension)   . Carotid artery disease     TOTALLY OCCLUDED LEFT CAROTID  . Coronary artery disease   . CAD (coronary artery disease) 1981  . Atrial fibrillation   . GERD (gastroesophageal reflux disease)   . Anemia     on arinesp  . AAA (abdominal aortic aneurysm)     Past Surgical History  Procedure Laterality Date  . Nasal septum surgery    . Cataract extraction, bilateral    . Coronary artery bypass graft  1999  . Pacemaker insertion  07/08/10    MDT pacemaker by Dr Deborah Chalk for tachy/brady syndrome  History  Smoking status  . Former Smoker -- 1.00 packs/day  . Types: Cigarettes  . Quit date: 09/05/1980  Smokeless tobacco  . Never Used    History  Alcohol Use No    Family History  Problem Relation Age of Onset  . Stroke Father   . Bone cancer Mother   . Coronary artery disease Child   . Coronary artery disease Child     Review of Systems: The review of systems is per the HPI.  All other systems were reviewed and are negative.  Physical Exam: BP 168/38  Pulse 66  Ht 6' (1.829 m)  Wt 169 lb 6.4 oz (76.839 kg)  BMI 22.97 kg/m2 Patient is very pleasant and in no acute distress. Skin is warm and dry. Color is normal.  HEENT is unremarkable. Normocephalic/atraumatic. PERRL. Sclera are nonicteric. Neck is supple. No masses. No JVD. Lungs are clear. Cardiac exam shows a regular rate and rhythm. Abdomen is soft. Extremities are without edema. Gait and ROM are intact. No gross neurologic  deficits noted.  LABORATORY DATA: Lab Results  Component Value Date   WBC 7.1 12/12/2012   HGB 11.3* 12/12/2012   HCT 35.2* 12/12/2012   PLT 224 12/12/2012   GLUCOSE 206* 07/06/2011   CHOL  Value: 119        ATP III CLASSIFICATION:  <200     mg/dL   Desirable  161-096  mg/dL   Borderline High  >=045    mg/dL   High        10/03/8117   TRIG 54 07/06/2010   HDL 48 07/06/2010   LDLCALC  Value: 60        Total Cholesterol/HDL:CHD Risk Coronary Heart Disease Risk Table                     Men   Women  1/2 Average Risk   3.4   3.3  Average Risk       5.0   4.4  2 X Average Risk   9.6   7.1  3 X Average Risk  23.4   11.0        Use the calculated Patient Ratio above and the CHD Risk Table to determine the patient's CHD Risk.        ATP III CLASSIFICATION (LDL):  <100     mg/dL   Optimal  147-829  mg/dL   Near or Above                    Optimal  130-159  mg/dL   Borderline  562-130  mg/dL   High  >865     mg/dL   Very High 7/84/6962   ALT 7 10/28/2010   AST 11 10/28/2010   NA 141 07/06/2011   K 5.1 07/06/2011   CL 106 07/06/2011   CREATININE 1.70* 07/06/2011   BUN 32* 07/06/2011   CO2 27 07/06/2011   TSH 0.369 07/06/2010   INR 1.29 07/06/2011   HGBA1C  Value: 7.2 (NOTE)                                                                       According to the ADA Clinical Practice Recommendations for  2011, when HbA1c is used as a screening test:   >=6.5%   Diagnostic of Diabetes Mellitus           (if abnormal result  is confirmed)  5.7-6.4%   Increased risk of developing Diabetes Mellitus  References:Diagnosis and Classification of Diabetes Mellitus,Diabetes Care,2011,34(Suppl 1):S62-S69 and Standards of Medical Care in         Diabetes - 2011,Diabetes Care,2011,34  (Suppl 1):S11-S61.* 07/06/2010     Assessment / Plan: 1. CAD - remote CABG - no symptoms reported.   2. Tachybrady with underlying pacemaker - due to check this month - will try to get for today.   3. HTN - BP is fair at home. Will follow. He has elected to  not have repeat renal duplex scanning.   4. Small AAA - with no plans for further evaluation.   5. PAF - checking pacemaker today. Seemed to be in sinus by exam today. On Xarelto with no issues noted.   Patient is agreeable to this plan and will call if any problems develop in the interim.   Rosalio Macadamia, RN, ANP-C [facility] [address] [place], [place]  [address]

## 2013-01-09 ENCOUNTER — Encounter (HOSPITAL_COMMUNITY): Payer: Self-pay

## 2013-01-09 ENCOUNTER — Encounter (HOSPITAL_COMMUNITY)
Admission: RE | Admit: 2013-01-09 | Discharge: 2013-01-09 | Disposition: A | Discharge status: Home / Self Care | Payer: Medicare Other | Source: Ambulatory Visit | Attending: Internal Medicine | Admitting: Internal Medicine

## 2013-01-09 VITALS — BP 174/44 | HR 67 | Temp 97.0°F | Resp 16 | Ht 72.0 in | Wt 169.0 lb

## 2013-01-09 DIAGNOSIS — D649 Anemia, unspecified: Secondary | ICD-10-CM | POA: Insufficient documentation

## 2013-01-09 LAB — CBC
HCT: 34.2 % — ABNORMAL LOW (ref 39.0–52.0)
MCH: 26.6 pg (ref 26.0–34.0)
MCHC: 32.5 g/dL (ref 30.0–36.0)
MCV: 82 fL (ref 78.0–100.0)
Platelets: 218 10*3/uL (ref 150–400)
RDW: 15 % (ref 11.5–15.5)

## 2013-01-09 NOTE — Progress Notes (Signed)
hgb 11.1.  Did not give aranesp per orders.  Faxed info to office.

## 2013-01-10 ENCOUNTER — Other Ambulatory Visit: Payer: Self-pay

## 2013-01-10 MED ORDER — METOPROLOL SUCCINATE ER 50 MG PO TB24: 50.0000 mg | ORAL_TABLET | Freq: Every day | ORAL | Status: DC

## 2013-01-11 ENCOUNTER — Encounter: Payer: Self-pay | Admitting: Internal Medicine

## 2013-01-14 ENCOUNTER — Other Ambulatory Visit: Payer: Self-pay | Admitting: *Deleted

## 2013-01-14 DIAGNOSIS — I4891 Unspecified atrial fibrillation: Secondary | ICD-10-CM

## 2013-01-14 DIAGNOSIS — I701 Atherosclerosis of renal artery: Secondary | ICD-10-CM

## 2013-01-14 DIAGNOSIS — I10 Essential (primary) hypertension: Secondary | ICD-10-CM

## 2013-01-14 MED ORDER — AMLODIPINE BESYLATE 10 MG PO TABS: 10.0000 mg | ORAL_TABLET | Freq: Every day | ORAL | Status: DC

## 2013-01-14 NOTE — Telephone Encounter (Signed)
Refill sent for 90 day

## 2013-01-28 ENCOUNTER — Other Ambulatory Visit: Payer: Self-pay | Admitting: Internal Medicine

## 2013-01-28 NOTE — Progress Notes (Signed)
Received confirmation from office that pt is going to receive Aranesp every 2 months.  Appointment has been made for Sept. And pt is aware.

## 2013-02-06 ENCOUNTER — Encounter (HOSPITAL_COMMUNITY): Payer: Medicare Other

## 2013-03-06 ENCOUNTER — Encounter (HOSPITAL_COMMUNITY): Payer: Self-pay

## 2013-03-06 ENCOUNTER — Encounter (HOSPITAL_COMMUNITY)
Admission: RE | Admit: 2013-03-06 | Discharge: 2013-03-06 | Disposition: A | Discharge status: Home / Self Care | Payer: Medicare Other | Source: Ambulatory Visit | Attending: Internal Medicine | Admitting: Internal Medicine

## 2013-03-06 VITALS — BP 179/31 | HR 68 | Temp 98.2°F | Resp 18 | Ht 72.0 in | Wt 169.0 lb

## 2013-03-06 DIAGNOSIS — D649 Anemia, unspecified: Secondary | ICD-10-CM

## 2013-03-06 LAB — CBC
MCH: 26.7 pg (ref 26.0–34.0)
MCHC: 32.4 g/dL (ref 30.0–36.0)
Platelets: 218 10*3/uL (ref 150–400)
RDW: 15.1 % (ref 11.5–15.5)

## 2013-05-08 ENCOUNTER — Encounter (HOSPITAL_COMMUNITY): Payer: Self-pay

## 2013-05-08 ENCOUNTER — Other Ambulatory Visit (HOSPITAL_COMMUNITY): Payer: Self-pay | Admitting: Internal Medicine

## 2013-05-08 ENCOUNTER — Encounter (HOSPITAL_COMMUNITY)
Admission: RE | Admit: 2013-05-08 | Discharge: 2013-05-08 | Disposition: A | Discharge status: Home / Self Care | Payer: Medicare Other | Source: Ambulatory Visit | Attending: Internal Medicine | Admitting: Internal Medicine

## 2013-05-08 VITALS — BP 173/40 | HR 78 | Temp 97.5°F | Resp 20

## 2013-05-08 DIAGNOSIS — D649 Anemia, unspecified: Secondary | ICD-10-CM | POA: Insufficient documentation

## 2013-05-08 LAB — CBC
MCV: 81.9 fL (ref 78.0–100.0)
Platelets: 248 10*3/uL (ref 150–400)
RDW: 14.5 % (ref 11.5–15.5)
WBC: 7.1 10*3/uL (ref 4.0–10.5)

## 2013-05-29 ENCOUNTER — Other Ambulatory Visit: Payer: Self-pay | Admitting: Cardiology

## 2013-05-30 ENCOUNTER — Encounter: Payer: Self-pay | Admitting: Internal Medicine

## 2013-06-05 ENCOUNTER — Encounter: Payer: Self-pay | Admitting: Family

## 2013-06-05 ENCOUNTER — Encounter: Payer: Self-pay | Admitting: Nurse Practitioner

## 2013-06-05 ENCOUNTER — Ambulatory Visit (INDEPENDENT_AMBULATORY_CARE_PROVIDER_SITE_OTHER): Payer: Medicare Other | Admitting: Nurse Practitioner

## 2013-06-05 VITALS — BP 180/40 | HR 71 | Ht 72.0 in | Wt 166.0 lb

## 2013-06-05 DIAGNOSIS — I2510 Atherosclerotic heart disease of native coronary artery without angina pectoris: Secondary | ICD-10-CM

## 2013-06-05 NOTE — Progress Notes (Signed)
Steven Callahan Date of Birth: Sep 25, 1920 Medical Record #[id number]  History of Present Illness: Steven Callahan is seen back today for a follow up visit. Seen for Dr. Elease Hashimoto - former patient of Dr. Ronnald Nian. He is now 79 years of age. He has CAD, remote CABG, DM, HTN, anemia (on Aranesp), aortic insufficiency and paroxysmal atrial fib. He has a pacemaker for tachybrady. He is on chronic Xarelto. Has a known left ICA occlusion and is followed by [facility]. Has not tolerated amiodarone in the past. Followed by Dr. Timothy Lasso in primary care.   Aspirin was stopped at a prior visit due to issues with his anemia.   Seen back in July. He was doing ok. He did not wish to have his repeat renal duplex or abdominal ultrasound. He has opted for conservative management.   Comes in today. Here with Steven Callahan, his wife.  Says he is doing about the same. No chest pain. Does have little "twinges" if he over exerts. He remains very active. Still works in his yard. BP still labile - sometimes necessitating that he hold medicines and sometimes needing additional medicines. He is still not interested in having any more tests or procedures. Not short of breath. No falls. Saw Dr. Timothy Lasso last week with his labs - was started on Zocor.   Current Outpatient Prescriptions  Medication Sig Dispense Refill  . ALPRAZolam (XANAX) 0.5 MG tablet Take 0.5 mg by mouth at bedtime.        Marland Kitchen amLODipine (NORVASC) 10 MG tablet Take 1 tablet (10 mg total) by mouth daily.  90 tablet  3  . bisacodyl (DULCOLAX) 5 MG EC tablet Take 5 mg by mouth daily as needed.        . hydrALAZINE (APRESOLINE) 25 MG tablet Take 50 mg by mouth 3 (three) times daily as needed.       . isosorbide mononitrate (IMDUR) 60 MG 24 hr tablet Take 60 mg by mouth daily.        . Linagliptin (TRADJENTA) 5 MG TABS Take 5 mg by mouth daily.        . metoprolol succinate (TOPROL-XL) 50 MG 24 hr tablet Take 1 tablet (50 mg total) by mouth daily.  90 tablet  1  . nitroGLYCERIN (NITROSTAT) 0.4  MG SL tablet Place 1 tablet (0.4 mg total) under the tongue every 5 (five) minutes as needed.  25 tablet  5  . Rivaroxaban (XARELTO) 15 MG TABS tablet TAKE 1 TABLET (15 MG TOTAL) BY MOUTH DAILY.  30 tablet  6  . traMADol (ULTRAM) 50 MG tablet Take 50 mg by mouth every 8 (eight) hours as needed.       No current facility-administered medications for this visit.    Allergies  Allergen Reactions  . Ibuprofen Hives    Past Medical History  Diagnosis Date  . Murmur   . 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 HTN (hypertension)   . Carotid artery disease     TOTALLY OCCLUDED LEFT CAROTID  . Coronary artery disease   . CAD (coronary artery disease) 1981  . Atrial fibrillation   . GERD (gastroesophageal reflux disease)   . Anemia     on arinesp  . AAA (abdominal aortic aneurysm)     Past Surgical History  Procedure Laterality Date  . Nasal septum surgery    . Cataract extraction, bilateral    . Coronary artery bypass graft  1999  . Pacemaker insertion  07/08/10  MDT pacemaker by Dr Deborah Chalk for tachy/brady syndrome    History  Smoking status  . Former Smoker -- 1.00 packs/day  . Types: Cigarettes  . Quit date: 09/05/1980  Smokeless tobacco  . Never Used    History  Alcohol Use No    Family History  Problem Relation Age of Onset  . Stroke Father   . Bone cancer Mother   . Coronary artery disease Child   . Coronary artery disease Child     Review of Systems: The review of systems is per the HPI.  All other systems were reviewed and are negative.  Physical Exam: BP 180/40  Pulse 71  Ht 6' (1.829 m)  Wt 166 lb (75.297 kg)  BMI 22.51 kg/m2  SpO2 95% BP by me is 160/40 Patient is very pleasant and in no acute distress. Skin is warm and dry. Color is normal.  HEENT is unremarkable. Normocephalic/atraumatic. PERRL. Sclera are nonicteric. Neck is supple. No masses. No JVD. Lungs are clear. Cardiac exam shows a regular rate and rhythm.  Murmur noted at the apex. Abdomen is soft. Extremities are without edema. Gait and ROM are intact. No gross neurologic deficits noted.  LABORATORY DATA: Per PCP  Lab Results  Component Value Date   WBC 7.1 05/08/2013   HGB 11.3* 05/08/2013   HCT 34.4* 05/08/2013   PLT 248 05/08/2013   GLUCOSE 206* 07/06/2011   CHOL  Value: 119        ATP III CLASSIFICATION:  <200     mg/dL   Desirable  161-096  mg/dL   Borderline High  >=045    mg/dL   High        10/03/8117   TRIG 54 07/06/2010   HDL 48 07/06/2010   LDLCALC  Value: 60        Total Cholesterol/HDL:CHD Risk Coronary Heart Disease Risk Table                     Men   Women  1/2 Average Risk   3.4   3.3  Average Risk       5.0   4.4  2 X Average Risk   9.6   7.1  3 X Average Risk  23.4   11.0        Use the calculated Patient Ratio above and the CHD Risk Table to determine the patient's CHD Risk.        ATP III CLASSIFICATION (LDL):  <100     mg/dL   Optimal  147-829  mg/dL   Near or Above                    Optimal  130-159  mg/dL   Borderline  562-130  mg/dL   High  >865     mg/dL   Very High 7/84/6962   ALT 7 10/28/2010   AST 11 10/28/2010   NA 141 07/06/2011   K 5.1 07/06/2011   CL 106 07/06/2011   CREATININE 1.70* 07/06/2011   BUN 32* 07/06/2011   CO2 27 07/06/2011   TSH 0.369 07/06/2010   INR 1.29 07/06/2011   HGBA1C  Value: 7.2 (NOTE)  According to the ADA Clinical Practice Recommendations for 2011, when HbA1c is used as a screening test:   >=6.5%   Diagnostic of Diabetes Mellitus           (if abnormal result  is confirmed)  5.7-6.4%   Increased risk of developing Diabetes Mellitus  References:Diagnosis and Classification of Diabetes Mellitus,Diabetes Care,2011,34(Suppl 1):S62-S69 and Standards of Medical Care in         Diabetes - 2011,Diabetes Care,2011,34  (Suppl 1):S11-S61.* 07/06/2010   Assessment / Plan: 1. CAD - remote CABG - managed medically.  2. Labile HTN - had renal artery  stenosis noted on scan from 2013 - he is not interested in further testing even after lengthy discussion today.   3. Tachybrady - needs his pacemaker visit set up for next month per the recall  4. PAF - on Xarelto - no problems noted.   I will see him back in 6 months. He will let me know if he changes his mind about further testing. He is quite an active 79 year old in my opinion.  Patient is agreeable to this plan and will call if any problems develop in the interim.   Rosalio Macadamia, RN, ANP-C [facility] [address] [place], [place]  [address]

## 2013-06-05 NOTE — Patient Instructions (Addendum)
Stay on your current medicines  I will see you in 6 months  We will get you a visit with Dr. Johney Frame for next month for your pacemaker check  Call the [facility] office at [phone] if you have any questions, problems or concerns.

## 2013-06-06 ENCOUNTER — Ambulatory Visit (HOSPITAL_COMMUNITY)
Admission: RE | Admit: 2013-06-06 | Discharge: 2013-06-06 | Disposition: A | Discharge status: Home / Self Care | Payer: Medicare Other | Source: Ambulatory Visit | Attending: Family | Admitting: Family

## 2013-06-06 ENCOUNTER — Other Ambulatory Visit (HOSPITAL_COMMUNITY): Payer: Medicare Other

## 2013-06-06 ENCOUNTER — Ambulatory Visit (INDEPENDENT_AMBULATORY_CARE_PROVIDER_SITE_OTHER): Payer: Medicare Other | Admitting: Family

## 2013-06-06 ENCOUNTER — Encounter: Payer: Self-pay | Admitting: Family

## 2013-06-06 DIAGNOSIS — I6529 Occlusion and stenosis of unspecified carotid artery: Secondary | ICD-10-CM

## 2013-06-06 NOTE — Progress Notes (Signed)
Established Carotid Patient  History of Present Illness  Steven Callahan is a 79 y.o. male patient of Dr. Adele Dan with a known left ICA occlusion.   Patient has not had previous CEA.  Patient has Negative history of TIA or stroke symptom.  The patient denies amaurosis fugax or monocular blindness.  The patient  denies facial drooping.  Pt. denies hemiplegia.  The patient denies receptive or expressive aphasia.  Pt. denies extremity weakness. He denies steal symptoms.   Patient reports New Medical or Surgical History: 2 units blood transfused about a year ago for anemia, also seem to be getting epo injections. He has significant cardiac history, has a pacemaker,  Pt Diabetic: Yes, states in good control, states 6.? A1C recently Pt smoker: non-smoker  Pt meds include: Statin : Yes Betablocker: Yes ASA: No Other anticoagulants/antiplatelets: Xaralto, atril fib. Hx documented   Past Medical History  Diagnosis Date  . Murmur   . 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 HTN (hypertension)   . Carotid artery disease     TOTALLY OCCLUDED LEFT CAROTID  . Coronary artery disease   . CAD (coronary artery disease) 1981  . Atrial fibrillation   . GERD (gastroesophageal reflux disease)   . Anemia     on arinesp  . AAA (abdominal aortic aneurysm)   . CHF (congestive heart failure)   . Diabetes mellitus without complication   . Myocardial infarction     79 yrs old    Social History History  Substance Use Topics  . Smoking status: Former Smoker -- 1.00 packs/day    Types: Cigarettes    Quit date: 09/05/1980  . Smokeless tobacco: Never Used  . Alcohol Use: No    Family History Family History  Problem Relation Age of Onset  . Stroke Father   . Bone cancer Mother   . Cancer Mother   . Coronary artery disease Child   . Coronary artery disease Child   . Cancer Sister   . Heart disease Daughter     Heart Disease before age 22  . Heart disease Son      Heart Disease before age 5-  AAA    Surgical History Past Surgical History  Procedure Laterality Date  . Nasal septum surgery    . Cataract extraction, bilateral    . Coronary artery bypass graft  1999  . Pacemaker insertion  07/08/10    MDT pacemaker by Dr Deborah Chalk for tachy/brady syndrome    Allergies  Allergen Reactions  . Ibuprofen Hives    Current Outpatient Prescriptions  Medication Sig Dispense Refill  . ALPRAZolam (XANAX) 0.5 MG tablet Take 0.5 mg by mouth at bedtime.        Marland Kitchen amLODipine (NORVASC) 10 MG tablet Take 1 tablet (10 mg total) by mouth daily.  90 tablet  3  . bisacodyl (DULCOLAX) 5 MG EC tablet Take 5 mg by mouth daily as needed.        . hydrALAZINE (APRESOLINE) 25 MG tablet Take 50 mg by mouth 3 (three) times daily as needed.       . isosorbide mononitrate (IMDUR) 60 MG 24 hr tablet Take 60 mg by mouth daily.        . Linagliptin (TRADJENTA) 5 MG TABS Take 5 mg by mouth daily.        . metoprolol succinate (TOPROL-XL) 50 MG 24 hr tablet Take 1 tablet (50 mg total) by mouth daily.  90 tablet  1  . nitroGLYCERIN (NITROSTAT) 0.4 MG SL tablet Place 1 tablet (0.4 mg total) under the tongue every 5 (five) minutes as needed.  25 tablet  5  . Rivaroxaban (XARELTO) 15 MG TABS tablet TAKE 1 TABLET (15 MG TOTAL) BY MOUTH DAILY.  30 tablet  6  . simvastatin (ZOCOR) 20 MG tablet Take 20 mg by mouth daily.       . traMADol (ULTRAM) 50 MG tablet Take 50 mg by mouth every 8 (eight) hours as needed.       No current facility-administered medications for this visit.    Review of Systems : [x]  Positive   [ ]  Denies  General:[ ]  Weight loss,  [ ]  Weight gain, [ ]  Loss of appetite, [ ]  Fever, [ ]  chills  Neurologic: [ ]  Dizziness, [ ]  Blackouts, [ ]  Headaches, [ ]  Seizure [ ]  Stroke, [ ]  "Mini stroke", [ ]  Slurred speech, [ ]  Temporary blindness;  [ ] weakness,  Ear/Nose/Throat: [ ]  Change in hearing, [ ]  Nose bleeds, [ ]  Hoarseness  Vascular:[ ]  Pain in legs with  walking, [ ]  Pain in feet while lying flat , [ ]   Non-healing ulcer, [ ]  Blood clot in vein,    Pulmonary: [ ]  Home oxygen, [ ]   Productive cough, [ ]  Bronchitis, [ ]  Coughing up blood,  [ ]  Asthma, [ ]  Wheezing  Musculoskeletal:  [ ]  Arthritis, [ ]  Joint pain, [ ]  low back pain  Cardiac: [ ]  Chest pain, [ ]  Shortness of breath when lying flat, [ ]  Shortness of breath with exertion, [ ]  Palpitations, [ ]  Heart murmur, [ ]   Atrial fibrillation  Hematologic:[ ]  Easy Bruising, [ ]  Anemia; [ ]  Hepatitis  Psychiatric: [ ]   Depression, [ ]  Anxiety   Gastrointestinal: [ ]  Black stool, [ ]  Blood in stool, [ ]  Peptic ulcer disease,  [ ]  Gastroesophageal Reflux, [ ]  Trouble swallowing, [ ]  Diarrhea, [ ]  Constipation  Urinary: [ ]  chronic Kidney disease, [ ]  on HD, [ ]  Burning with urination, [ ]  Frequent urination, [ ]  Difficulty urinating;   Skin: [ ]  Rashes, [ ]  Wounds    Physical Examination  Filed Vitals:   06/06/13 1139  BP: 175/44  Pulse: 64  Resp: 16   Filed Weights   06/06/13 1139  Weight: 168 lb (76.204 kg)   Body mass index is 22.78 kg/(m^2).   General: WDWN male in NAD GAIT: normal Eyes: PERRLA Pulmonary:  CTAB, Negative  Rales, Negative rhonchi, & Negative wheezing.  Cardiac: regular Rhythm ,  Positive Murmurs.  VASCULAR EXAM Carotid Bruits Left Right   Positive Positive     Radial pulses are 3+ palpable and equal.                                                                                                                            LE Pulses LEFT RIGHT       POPLITEAL  not palpable   not palpable       POSTERIOR TIBIAL   palpable   not palpable        DORSALIS PEDIS      ANTERIOR TIBIAL  palpable   palpable     Gastrointestinal: soft, nontender, BS WNL, no r/g,  negative masses.  Musculoskeletal: Negative muscle atrophy/wasting. M/S 5/5 throughout, Extremities without ischemic changes.  Neurologic: A&O X 3; Appropriate Affect ; SENSATION ;normal;   Speech is normal CN 2-12 intact except is slightly hard of hearing, Pain and light touch intact in extremities, Motor exam as listed above.   Non-Invasive Vascular Imaging CAROTID DUPLEX 06/06/2013   Right ICA: <40% stenosis. Left ICA: 0ccluded stenosis.  Previous carotid studies demonstrated: RICA 40 - 59 % stenosis, LICA 0ccluded stenosis.  These findings are Improved from previous exam.  Assessment: Steven Callahan is a 79 y.o. male who presents with asymptomatic <40% Right ICA  Stenosis and known left ICA occlusion.  The right  ICA stenosis is  Improved from previous exam. Is somewhat hypertensive today, left arm 180/40; advised patient to work closely with his PCP for hypertension management.  Plan: Follow-up in 1 year with Carotid Duplex scan.   I discussed in depth with the patient the nature of atherosclerosis, and emphasized the importance of maximal medical management including strict control of blood pressure, blood glucose, and lipid levels, obtaining regular exercise, and continued cessation of smoking.  The patient is aware that without maximal medical management the underlying atherosclerotic disease process will progress, limiting the benefit of any interventions. The patient was given information about stroke prevention and what symptoms should prompt the patient to seek immediate medical care. Thank you for allowing [place] to participate in this patient's care.  Charisse March, RN, MSN, FNP-C [facility] Office: [phone]  Clinic Physician: Darrick Penna  06/06/2013 11:46 AM

## 2013-06-06 NOTE — Patient Instructions (Signed)
Stroke Prevention Some medical conditions and behaviors are associated with an increased chance of having a stroke. You may prevent a stroke by making healthy choices and managing medical conditions. Reduce your risk of having a stroke by:  Staying physically active. Get at least 30 minutes of activity on most or all days.  Not smoking. It may also be helpful to avoid exposure to secondhand smoke.  Limiting alcohol use. Moderate alcohol use is considered to be:  No more than 2 drinks per day for men.  No more than 1 drink per day for nonpregnant women.  Eating healthy foods.  Include 5 or more servings of fruits and vegetables a day.  Certain diets may be prescribed to address high blood pressure, high cholesterol, diabetes, or obesity.  Managing your cholesterol levels.  A low-saturated fat, low-trans fat, low-cholesterol, and high-fiber diet may control cholesterol levels.  Take any prescribed medicines to control cholesterol as directed by your caregiver.  Managing your diabetes.  A controlled-carbohydrate, controlled-sugar diet is recommended to manage diabetes.  Take any prescribed medicines to control diabetes as directed by your caregiver.  Controlling your high blood pressure (hypertension).  A low-salt (sodium), low-saturated fat, low-trans fat, and low-cholesterol diet is recommended to manage high blood pressure.  Take any prescribed medicines to control hypertension as directed by your caregiver.  Maintaining a healthy weight.  A reduced-calorie, low-sodium, low-saturated fat, low-trans fat, low-cholesterol diet is recommended to manage weight.  Stopping drug abuse.  Avoiding birth control pills.  Talk to your caregiver about the risks of taking birth control pills if you are over 35 years old, smoke, get migraines, or have ever had a blood clot.  Getting evaluated for sleep disorders (sleep apnea).  Talk to your caregiver about getting a sleep evaluation  if you snore a lot or have excessive sleepiness.  Taking medicines as directed by your caregiver.  For some people, aspirin or blood thinners (anticoagulants) are helpful in reducing the risk of forming abnormal blood clots that can lead to stroke. If you have the irregular heart rhythm of atrial fibrillation, you should be on a blood thinner unless there is a good reason you cannot take them.  Understand all your medicine instructions. SEEK IMMEDIATE MEDICAL CARE IF:   You have sudden weakness or numbness of the face, arm, or leg, especially on one side of the body.  You have sudden confusion.  You have trouble speaking (aphasia) or understanding.  You have sudden trouble seeing in one or both eyes.  You have sudden trouble walking.  You have dizziness.  You have a loss of balance or coordination.  You have a sudden, severe headache with no known cause.  You have new chest pain or an irregular heartbeat. Any of these symptoms may represent a serious problem that is an emergency. Do not wait to see if the symptoms will go away. Get medical help right away. Call your local emergency services (911 in [place] Do not drive yourself to the hospital. Document Released: 07/21/2004 Document Revised: 09/05/2011 Document Reviewed: 12/14/2012 Exit[employer] Patient Information 2014 [employer].  

## 2013-07-10 ENCOUNTER — Encounter (HOSPITAL_COMMUNITY): Admission: RE | Admit: 2013-07-10 | Payer: Medicare Other | Source: Ambulatory Visit

## 2013-07-12 ENCOUNTER — Other Ambulatory Visit: Payer: Self-pay | Admitting: Internal Medicine

## 2013-07-17 ENCOUNTER — Encounter: Payer: Self-pay | Admitting: Internal Medicine

## 2013-07-17 ENCOUNTER — Ambulatory Visit (INDEPENDENT_AMBULATORY_CARE_PROVIDER_SITE_OTHER): Payer: Medicare Other | Admitting: Internal Medicine

## 2013-07-17 VITALS — BP 168/70 | HR 89 | Ht 72.0 in | Wt 160.0 lb

## 2013-07-17 DIAGNOSIS — I495 Sick sinus syndrome: Secondary | ICD-10-CM

## 2013-07-17 DIAGNOSIS — I10 Essential (primary) hypertension: Secondary | ICD-10-CM

## 2013-07-17 DIAGNOSIS — I4891 Unspecified atrial fibrillation: Secondary | ICD-10-CM

## 2013-07-17 DIAGNOSIS — Z950 Presence of cardiac pacemaker: Secondary | ICD-10-CM

## 2013-07-17 DIAGNOSIS — I2510 Atherosclerotic heart disease of native coronary artery without angina pectoris: Secondary | ICD-10-CM

## 2013-07-17 LAB — MDC_IDC_ENUM_SESS_TYPE_INCLINIC
Battery Impedance: 252 Ohm
Battery Remaining Longevity: 108 mo
Brady Statistic AP VP Percent: 0 %
Brady Statistic AS VS Percent: 38 %
Lead Channel Impedance Value: 446 Ohm
Lead Channel Impedance Value: 498 Ohm
Lead Channel Pacing Threshold Amplitude: 1 V
Lead Channel Sensing Intrinsic Amplitude: 4 mV
Lead Channel Setting Pacing Amplitude: 2 V
MDC IDC MSMT BATTERY VOLTAGE: 2.79 V
MDC IDC MSMT LEADCHNL RA PACING THRESHOLD PULSEWIDTH: 0.4 ms
MDC IDC MSMT LEADCHNL RV PACING THRESHOLD AMPLITUDE: 0.5 V
MDC IDC MSMT LEADCHNL RV PACING THRESHOLD PULSEWIDTH: 0.4 ms
MDC IDC MSMT LEADCHNL RV SENSING INTR AMPL: 11.2 mV
MDC IDC SESS DTM: 20150121125527
MDC IDC SET LEADCHNL RV PACING AMPLITUDE: 2.5 V
MDC IDC SET LEADCHNL RV PACING PULSEWIDTH: 0.4 ms
MDC IDC SET LEADCHNL RV SENSING SENSITIVITY: 4 mV
MDC IDC STAT BRADY AP VS PERCENT: 61 %
MDC IDC STAT BRADY AS VP PERCENT: 0 %

## 2013-07-17 NOTE — Patient Instructions (Addendum)
Your physician wants you to follow-up in: 6 months with Steven FredricksonLori Gerhardt, NP and have device checked at that visit and 12 months with Steven CoriaBrooke Edmisten,NP You will receive a reminder letter in the mail two months in advance. If you don't receive a letter, please call our office to schedule the follow-up appointment.

## 2013-07-17 NOTE — Progress Notes (Signed)
PCP:  Gwen PoundsUSSO,JOHN M, MD Primary Cardiologist:  Dr Elease HashimotoNahser  The patient presents today for routine electrophysiology followup.  Since last being seen in our clinic, the patient reports doing reasonably well.  He remains active for his age.  Today, he denies symptoms of palpitations, shortness of breath, lower extremity edema, dizziness, presyncope, or syncope.  The patient feels that he is tolerating medications without difficulties and is otherwise without complaint today.   Past Medical History  Diagnosis Date  . Murmur   . 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 HTN (hypertension)   . Carotid artery disease     TOTALLY OCCLUDED LEFT CAROTID  . Coronary artery disease   . CAD (coronary artery disease) 1981  . Atrial fibrillation   . GERD (gastroesophageal reflux disease)   . Anemia     on arinesp  . AAA (abdominal aortic aneurysm)   . CHF (congestive heart failure)   . Diabetes mellitus without complication   . Myocardial infarction     80 yrs old   Past Surgical History  Procedure Laterality Date  . Nasal septum surgery    . Cataract extraction, bilateral    . Coronary artery bypass graft  1999  . Pacemaker insertion  07/08/10    MDT pacemaker by Dr Deborah Chalkennant for tachy/brady syndrome    Current Outpatient Prescriptions  Medication Sig Dispense Refill  . ALPRAZolam (XANAX) 0.5 MG tablet Take 0.5 mg by mouth at bedtime.        Marland Kitchen. amLODipine (NORVASC) 10 MG tablet Take 1 tablet (10 mg total) by mouth daily.  90 tablet  3  . bisacodyl (DULCOLAX) 5 MG EC tablet Take 5 mg by mouth daily as needed.        . cefdinir (OMNICEF) 300 MG capsule Take 150 mg by mouth daily.      . hydrALAZINE (APRESOLINE) 25 MG tablet Take 50 mg by mouth 3 (three) times daily as needed.       . isosorbide mononitrate (IMDUR) 60 MG 24 hr tablet Take 60 mg by mouth daily.        . metoprolol succinate (TOPROL-XL) 50 MG 24 hr tablet TAKE 1 TABLET (50 MG TOTAL) BY MOUTH DAILY.  90  tablet  1  . nitroGLYCERIN (NITROSTAT) 0.4 MG SL tablet Place 1 tablet (0.4 mg total) under the tongue every 5 (five) minutes as needed.  25 tablet  5  . Rivaroxaban (XARELTO) 15 MG TABS tablet TAKE 1 TABLET (15 MG TOTAL) BY MOUTH DAILY.  30 tablet  6  . simvastatin (ZOCOR) 20 MG tablet Take 20 mg by mouth daily.       . traMADol (ULTRAM) 50 MG tablet Take 50 mg by mouth every 8 (eight) hours as needed.       No current facility-administered medications for this visit.    Allergies  Allergen Reactions  . Ibuprofen Hives    History   Social History  . Marital Status: Married    Spouse Name: N/A    Number of Children: N/A  . Years of Education: N/A   Occupational History  . Not on file.   Social History Main Topics  . Smoking status: Former Smoker -- 1.00 packs/day    Types: Cigarettes    Quit date: 09/05/1980  . Smokeless tobacco: Never Used  . Alcohol Use: No  . Drug Use: No  . Sexual Activity: Not on file   Other Topics Concern  . Not  on file   Social History Narrative  . No narrative on file    Family History  Problem Relation Age of Onset  . Stroke Father   . Bone cancer Mother   . Cancer Mother   . Coronary artery disease Child   . Coronary artery disease Child   . Cancer Sister   . Heart disease Daughter     Heart Disease before age 67  . Heart disease Son     Heart Disease before age 41-  AAA   Physical Exam: Filed Vitals:   07/17/13 1236  BP: 168/70  Pulse: 89  Height: 6' (1.829 m)  Weight: 160 lb (72.576 kg)    GEN- The patient is well appearing, alert and oriented x 3 today.   Head- normocephalic, atraumatic Eyes-  Sclera clear, conjunctiva pink Ears- hearing intact Oropharynx- clear Neck- supple, + bilateral bruits Lymph- no cervical lymphadenopathy Lungs- Clear to ausculation bilaterally, normal work of breathing Chest- pacemaker pocket is well healed Heart- Regular rate and rhythm, 2/6 early SEM LUB, 2/6 diastolic murmuc LSB GI-  soft, NT, ND, + BS Extremities- no clubbing, cyanosis, or edema  Pacemaker interrogation- reviewed in detail today,  See PACEART report  Assessment and Plan:   1. Tachycardia/ bradycardia syndrome Normal pacemaker function See Pace Art report No changes today  2. afib Well controlled Continue xarelto for stroke prevention I do not see recent BMET results.  I will refer to the anticoagulation clinic for management of xarelto in this highly functional but elderly patient  3. HTN He reports better blood pressure control at home and is reluctant to make changes today  4. CAD Stable No change required today  carelink Return to see Nehemiah Settle for device management in 1year followup with Lori/ Dr Elease Hashimoto as scheduled

## 2013-07-29 ENCOUNTER — Other Ambulatory Visit: Payer: Self-pay | Admitting: Internal Medicine

## 2013-07-29 ENCOUNTER — Other Ambulatory Visit: Payer: Self-pay | Admitting: *Deleted

## 2013-07-29 MED ORDER — RIVAROXABAN 15 MG PO TABS: ORAL_TABLET | ORAL | Status: DC

## 2013-08-05 ENCOUNTER — Encounter (HOSPITAL_COMMUNITY)
Admission: RE | Admit: 2013-08-05 | Discharge: 2013-08-05 | Disposition: A | Discharge status: Home / Self Care | Payer: Medicare Other | Source: Ambulatory Visit | Attending: Internal Medicine | Admitting: Internal Medicine

## 2013-08-05 ENCOUNTER — Encounter (HOSPITAL_COMMUNITY): Payer: Self-pay

## 2013-08-05 VITALS — BP 168/61 | HR 83 | Temp 96.7°F | Resp 18 | Ht 72.0 in | Wt 160.0 lb

## 2013-08-05 DIAGNOSIS — D649 Anemia, unspecified: Secondary | ICD-10-CM | POA: Insufficient documentation

## 2013-08-05 LAB — CBC
HCT: 32.9 % — ABNORMAL LOW (ref 39.0–52.0)
Hemoglobin: 10.6 g/dL — ABNORMAL LOW (ref 13.0–17.0)
MCH: 26.2 pg (ref 26.0–34.0)
MCHC: 32.2 g/dL (ref 30.0–36.0)
MCV: 81.2 fL (ref 78.0–100.0)
PLATELETS: 333 10*3/uL (ref 150–400)
RBC: 4.05 MIL/uL — AB (ref 4.22–5.81)
RDW: 14.4 % (ref 11.5–15.5)
WBC: 8.9 10*3/uL (ref 4.0–10.5)

## 2013-08-05 NOTE — Discharge Instructions (Signed)
Darbepoetin Alfa injection What is this medicine? DARBEPOETIN ALFA (dar be POE e tin AL fa) helps your body make more red blood cells. It is used to treat anemia caused by chronic kidney failure and chemotherapy. This medicine may be used for other purposes; ask your health care provider or pharmacist if you have questions. COMMON BRAND NAME(S): Aranesp What should I tell my health care provider before I take this medicine? They need to know if you have any of these conditions: -blood clotting disorders or history of blood clots -cancer patient not on chemotherapy -cystic fibrosis -heart disease, such as angina, heart failure, or a history of a heart attack -hemoglobin level of 12 g/dL or greater -high blood pressure -low levels of folate, iron, or vitamin B12 -seizures -an unusual or allergic reaction to darbepoetin, erythropoietin, albumin, hamster proteins, latex, other medicines, foods, dyes, or preservatives -pregnant or trying to get pregnant -breast-feeding How should I use this medicine? This medicine is for injection into a vein or under the skin. It is usually given by a health care professional in a hospital or clinic setting. If you get this medicine at home, you will be taught how to prepare and give this medicine. Do not shake the solution before you withdraw a dose. Use exactly as directed. Take your medicine at regular intervals. Do not take your medicine more often than directed. It is important that you put your used needles and syringes in a special sharps container. Do not put them in a trash can. If you do not have a sharps container, call your pharmacist or healthcare provider to get one. Talk to your pediatrician regarding the use of this medicine in children. While this medicine may be used in children as young as 1 year for selected conditions, precautions do apply. Overdosage: If you think you have taken too much of this medicine contact a poison control center or  emergency room at once. NOTE: This medicine is only for you. Do not share this medicine with others. What if I miss a dose? If you miss a dose, take it as soon as you can. If it is almost time for your next dose, take only that dose. Do not take double or extra doses. What may interact with this medicine? Do not take this medicine with any of the following medications: -epoetin alfa This list may not describe all possible interactions. Give your health care provider a list of all the medicines, herbs, non-prescription drugs, or dietary supplements you use. Also tell them if you smoke, drink alcohol, or use illegal drugs. Some items may interact with your medicine. What should I watch for while using this medicine? Visit your prescriber or health care professional for regular checks on your progress and for the needed blood tests and blood pressure measurements. It is especially important for the doctor to make sure your hemoglobin level is in the desired range, to limit the risk of potential side effects and to give you the best benefit. Keep all appointments for any recommended tests. Check your blood pressure as directed. Ask your doctor what your blood pressure should be and when you should contact him or her. As your body makes more red blood cells, you may need to take iron, folic acid, or vitamin B supplements. Ask your doctor or health care provider which products are right for you. If you have kidney disease continue dietary restrictions, even though this medication can make you feel better. Talk with your doctor or health  care professional about the foods you eat and the vitamins that you take. What side effects may I notice from receiving this medicine? Side effects that you should report to your doctor or health care professional as soon as possible: -allergic reactions like skin rash, itching or hives, swelling of the face, lips, or tongue -breathing problems -changes in vision -chest  pain -confusion, trouble speaking or understanding -feeling faint or lightheaded, falls -high blood pressure -muscle aches or pains -pain, swelling, warmth in the leg -rapid weight gain -severe headaches -sudden numbness or weakness of the face, arm or leg -trouble walking, dizziness, loss of balance or coordination -seizures (convulsions) -swelling of the ankles, feet, hands -unusually weak or tired Side effects that usually do not require medical attention (report to your doctor or health care professional if they continue or are bothersome): -diarrhea -fever, chills (flu-like symptoms) -headaches -nausea, vomiting -redness, stinging, or swelling at site where injected This list may not describe all possible side effects. Call your doctor for medical advice about side effects. You may report side effects to FDA at 1-800-FDA-1088. Where should I keep my medicine? Keep out of the reach of children. Store in a refrigerator between 2 and 8 degrees C (36 and 46 degrees F). Do not freeze. Do not shake. Throw away any unused portion if using a single-dose vial. Throw away any unused medicine after the expiration date. NOTE: This sheet is a summary. It may not cover all possible information. If you have questions about this medicine, talk to your doctor, pharmacist, or health care provider.  2014, Elsevier/Gold Standard. (2008-05-27 10:23:57) Anemia, Nonspecific Anemia is a condition in which the concentration of red blood cells or hemoglobin in the blood is below normal. Hemoglobin is a substance in red blood cells that carries oxygen to the tissues of the body. Anemia results in not enough oxygen reaching these tissues.  CAUSES  Common causes of anemia include:   Excessive bleeding. Bleeding may be internal or external. This includes excessive bleeding from periods (in women) or from the intestine.   Poor nutrition.   Chronic kidney, thyroid, and liver disease.  Bone marrow  disorders that decrease red blood cell production.  Cancer and treatments for cancer.  HIV, AIDS, and their treatments.  Spleen problems that increase red blood cell destruction.  Blood disorders.  Excess destruction of red blood cells due to infection, medicines, and autoimmune disorders. SIGNS AND SYMPTOMS   Minor weakness.   Dizziness.   Headache.  Palpitations.   Shortness of breath, especially with exercise.   Paleness.  Cold sensitivity.  Indigestion.  Nausea.  Difficulty sleeping.  Difficulty concentrating. Symptoms may occur suddenly or they may develop slowly.  DIAGNOSIS  Additional blood tests are often needed. These help your health care provider determine the best treatment. Your health care provider will check your stool for blood and look for other causes of blood loss.  TREATMENT  Treatment varies depending on the cause of the anemia. Treatment can include:   Supplements of iron, vitamin B12, or folic acid.   Hormone medicines.   A blood transfusion. This may be needed if blood loss is severe.   Hospitalization. This may be needed if there is significant continual blood loss.   Dietary changes.  Spleen removal. HOME CARE INSTRUCTIONS Keep all follow-up appointments. It often takes many weeks to correct anemia, and having your health care provider check on your condition and your response to treatment is very important. SEEK IMMEDIATE MEDICAL CARE  IF:   You develop extreme weakness, shortness of breath, or chest pain.   You become dizzy or have trouble concentrating.  You develop heavy vaginal bleeding.   You develop a rash.   You have bloody or black, tarry stools.   You faint.   You vomit up blood.   You vomit repeatedly.   You have abdominal pain.  You have a fever or persistent symptoms for more than 2 3 days.   You have a fever and your symptoms suddenly get worse.   You are dehydrated.  MAKE SURE  YOU:  Understand these instructions.  Will watch your condition.  Will get help right away if you are not doing well or get worse. Document Released: 07/21/2004 Document Revised: 02/13/2013 Document Reviewed: 12/07/2012 [facility] Patient Information 2014 [employer], [place].

## 2013-08-26 ENCOUNTER — Encounter (HOSPITAL_COMMUNITY): Payer: Self-pay

## 2013-08-26 ENCOUNTER — Encounter (HOSPITAL_COMMUNITY)
Admission: RE | Admit: 2013-08-26 | Discharge: 2013-08-26 | Disposition: A | Discharge status: Home / Self Care | Payer: Medicare Other | Source: Ambulatory Visit | Attending: Internal Medicine | Admitting: Internal Medicine

## 2013-08-26 VITALS — BP 162/40 | HR 87 | Temp 97.9°F | Resp 18

## 2013-08-26 DIAGNOSIS — D649 Anemia, unspecified: Secondary | ICD-10-CM | POA: Insufficient documentation

## 2013-08-26 HISTORY — DX: Unspecified disorder of ear, unspecified ear: H93.90

## 2013-08-26 LAB — CBC
HEMATOCRIT: 33.7 % — AB (ref 39.0–52.0)
HEMOGLOBIN: 10.7 g/dL — AB (ref 13.0–17.0)
MCH: 25.9 pg — ABNORMAL LOW (ref 26.0–34.0)
MCHC: 31.8 g/dL (ref 30.0–36.0)
MCV: 81.6 fL (ref 78.0–100.0)
Platelets: 248 10*3/uL (ref 150–400)
RBC: 4.13 MIL/uL — AB (ref 4.22–5.81)
RDW: 15.1 % (ref 11.5–15.5)
WBC: 7.1 10*3/uL (ref 4.0–10.5)

## 2013-08-26 NOTE — Discharge Instructions (Signed)

## 2013-09-16 ENCOUNTER — Encounter (HOSPITAL_COMMUNITY): Payer: Self-pay

## 2013-09-16 ENCOUNTER — Encounter (HOSPITAL_COMMUNITY)
Admission: RE | Admit: 2013-09-16 | Discharge: 2013-09-16 | Disposition: A | Discharge status: Home / Self Care | Payer: Medicare Other | Source: Ambulatory Visit | Attending: Internal Medicine | Admitting: Internal Medicine

## 2013-09-16 VITALS — BP 174/48 | HR 62 | Temp 96.7°F | Resp 18

## 2013-09-16 DIAGNOSIS — D649 Anemia, unspecified: Secondary | ICD-10-CM

## 2013-09-16 LAB — CBC
HEMATOCRIT: 33.2 % — AB (ref 39.0–52.0)
HEMOGLOBIN: 10.7 g/dL — AB (ref 13.0–17.0)
MCH: 26.1 pg (ref 26.0–34.0)
MCHC: 32.2 g/dL (ref 30.0–36.0)
MCV: 81 fL (ref 78.0–100.0)
PLATELETS: 248 10*3/uL (ref 150–400)
RBC: 4.1 MIL/uL — AB (ref 4.22–5.81)
RDW: 14.8 % (ref 11.5–15.5)
WBC: 7.1 10*3/uL (ref 4.0–10.5)

## 2013-10-07 ENCOUNTER — Encounter (HOSPITAL_COMMUNITY)
Admission: RE | Admit: 2013-10-07 | Discharge: 2013-10-07 | Disposition: A | Discharge status: Home / Self Care | Payer: Medicare Other | Source: Ambulatory Visit | Attending: Internal Medicine | Admitting: Internal Medicine

## 2013-10-07 ENCOUNTER — Encounter (HOSPITAL_COMMUNITY): Payer: Self-pay

## 2013-10-07 VITALS — BP 170/39 | HR 62 | Temp 97.5°F | Resp 16

## 2013-10-07 DIAGNOSIS — D649 Anemia, unspecified: Secondary | ICD-10-CM | POA: Insufficient documentation

## 2013-10-07 LAB — CBC
HEMATOCRIT: 33.9 % — AB (ref 39.0–52.0)
HEMOGLOBIN: 10.7 g/dL — AB (ref 13.0–17.0)
MCH: 25.5 pg — AB (ref 26.0–34.0)
MCHC: 31.6 g/dL (ref 30.0–36.0)
MCV: 80.7 fL (ref 78.0–100.0)
Platelets: 251 10*3/uL (ref 150–400)
RBC: 4.2 MIL/uL — ABNORMAL LOW (ref 4.22–5.81)
RDW: 15.2 % (ref 11.5–15.5)
WBC: 7.2 10*3/uL (ref 4.0–10.5)

## 2013-10-07 NOTE — Progress Notes (Signed)
Patient stated blood pressure runs "much lower" at home. Instructed to follow up with primary care doctor if blood pressure is elevated at home. Verbalizes understanding.

## 2013-10-29 ENCOUNTER — Encounter (HOSPITAL_COMMUNITY): Payer: Medicare Other

## 2013-11-11 ENCOUNTER — Encounter (HOSPITAL_COMMUNITY)
Admission: RE | Admit: 2013-11-11 | Discharge: 2013-11-11 | Disposition: A | Discharge status: Home / Self Care | Payer: Medicare Other | Source: Ambulatory Visit | Attending: Internal Medicine | Admitting: Internal Medicine

## 2013-11-11 ENCOUNTER — Encounter (HOSPITAL_COMMUNITY): Payer: Self-pay

## 2013-11-11 VITALS — BP 167/41 | HR 66 | Temp 97.7°F | Resp 18

## 2013-11-11 DIAGNOSIS — D649 Anemia, unspecified: Secondary | ICD-10-CM | POA: Insufficient documentation

## 2013-11-11 LAB — CBC
HCT: 32.9 % — ABNORMAL LOW (ref 39.0–52.0)
Hemoglobin: 10.6 g/dL — ABNORMAL LOW (ref 13.0–17.0)
MCH: 25.9 pg — AB (ref 26.0–34.0)
MCHC: 32.2 g/dL (ref 30.0–36.0)
MCV: 80.4 fL (ref 78.0–100.0)
PLATELETS: 263 10*3/uL (ref 150–400)
RBC: 4.09 MIL/uL — AB (ref 4.22–5.81)
RDW: 15.6 % — ABNORMAL HIGH (ref 11.5–15.5)
WBC: 6 10*3/uL (ref 4.0–10.5)

## 2013-11-11 NOTE — Discharge Instructions (Signed)
Darbepoetin Alfa injection What is this medicine? DARBEPOETIN ALFA (dar be POE e tin AL fa) helps your body make more red blood cells. It is used to treat anemia caused by chronic kidney failure and chemotherapy. This medicine may be used for other purposes; ask your health care provider or pharmacist if you have questions. COMMON BRAND NAME(S): Aranesp What should I tell my health care provider before I take this medicine? They need to know if you have any of these conditions: -blood clotting disorders or history of blood clots -cancer patient not on chemotherapy -cystic fibrosis -heart disease, such as angina, heart failure, or a history of a heart attack -hemoglobin level of 12 g/dL or greater -high blood pressure -low levels of folate, iron, or vitamin B12 -seizures -an unusual or allergic reaction to darbepoetin, erythropoietin, albumin, hamster proteins, latex, other medicines, foods, dyes, or preservatives -pregnant or trying to get pregnant -breast-feeding How should I use this medicine? This medicine is for injection into a vein or under the skin. It is usually given by a health care professional in a hospital or clinic setting. If you get this medicine at home, you will be taught how to prepare and give this medicine. Do not shake the solution before you withdraw a dose. Use exactly as directed. Take your medicine at regular intervals. Do not take your medicine more often than directed. It is important that you put your used needles and syringes in a special sharps container. Do not put them in a trash can. If you do not have a sharps container, call your pharmacist or healthcare provider to get one. Talk to your pediatrician regarding the use of this medicine in children. While this medicine may be used in children as young as 1 year for selected conditions, precautions do apply. Overdosage: If you think you have taken too much of this medicine contact a poison control center or  emergency room at once. NOTE: This medicine is only for you. Do not share this medicine with others. What if I miss a dose? If you miss a dose, take it as soon as you can. If it is almost time for your next dose, take only that dose. Do not take double or extra doses. What may interact with this medicine? Do not take this medicine with any of the following medications: -epoetin alfa This list may not describe all possible interactions. Give your health care provider a list of all the medicines, herbs, non-prescription drugs, or dietary supplements you use. Also tell them if you smoke, drink alcohol, or use illegal drugs. Some items may interact with your medicine. What should I watch for while using this medicine? Visit your prescriber or health care professional for regular checks on your progress and for the needed blood tests and blood pressure measurements. It is especially important for the doctor to make sure your hemoglobin level is in the desired range, to limit the risk of potential side effects and to give you the best benefit. Keep all appointments for any recommended tests. Check your blood pressure as directed. Ask your doctor what your blood pressure should be and when you should contact him or her. As your body makes more red blood cells, you may need to take iron, folic acid, or vitamin B supplements. Ask your doctor or health care provider which products are right for you. If you have kidney disease continue dietary restrictions, even though this medication can make you feel better. Talk with your doctor or health   care professional about the foods you eat and the vitamins that you take. What side effects may I notice from receiving this medicine? Side effects that you should report to your doctor or health care professional as soon as possible: -allergic reactions like skin rash, itching or hives, swelling of the face, lips, or tongue -breathing problems -changes in vision -chest  pain -confusion, trouble speaking or understanding -feeling faint or lightheaded, falls -high blood pressure -muscle aches or pains -pain, swelling, warmth in the leg -rapid weight gain -severe headaches -sudden numbness or weakness of the face, arm or leg -trouble walking, dizziness, loss of balance or coordination -seizures (convulsions) -swelling of the ankles, feet, hands -unusually weak or tired Side effects that usually do not require medical attention (report to your doctor or health care professional if they continue or are bothersome): -diarrhea -fever, chills (flu-like symptoms) -headaches -nausea, vomiting -redness, stinging, or swelling at site where injected This list may not describe all possible side effects. Call your doctor for medical advice about side effects. You may report side effects to FDA at 1-800-FDA-1088. Where should I keep my medicine? Keep out of the reach of children. Store in a refrigerator between 2 and 8 degrees C (36 and 46 degrees F). Do not freeze. Do not shake. Throw away any unused portion if using a single-dose vial. Throw away any unused medicine after the expiration date. NOTE: This sheet is a summary. It may not cover all possible information. If you have questions about this medicine, talk to your doctor, pharmacist, or health care provider.  2014, Elsevier/Gold Standard. (2008-05-27 10:23:57)  

## 2013-11-11 NOTE — Progress Notes (Signed)
hgb 10.6 order is not clear so I called dr Timothy Lassorusso, per clarification give aranesp if hgb <10, Hold if >10.1  aranesp not given today

## 2013-11-19 ENCOUNTER — Encounter (HOSPITAL_COMMUNITY): Payer: Medicare Other

## 2013-12-04 ENCOUNTER — Ambulatory Visit (INDEPENDENT_AMBULATORY_CARE_PROVIDER_SITE_OTHER): Payer: Medicare Other | Admitting: *Deleted

## 2013-12-04 ENCOUNTER — Encounter: Payer: Self-pay | Admitting: Nurse Practitioner

## 2013-12-04 ENCOUNTER — Ambulatory Visit (INDEPENDENT_AMBULATORY_CARE_PROVIDER_SITE_OTHER): Payer: Medicare Other | Admitting: Nurse Practitioner

## 2013-12-04 VITALS — BP 180/30 | HR 66 | Ht 71.0 in | Wt 162.1 lb

## 2013-12-04 DIAGNOSIS — I2510 Atherosclerotic heart disease of native coronary artery without angina pectoris: Secondary | ICD-10-CM

## 2013-12-04 DIAGNOSIS — I480 Paroxysmal atrial fibrillation: Secondary | ICD-10-CM

## 2013-12-04 DIAGNOSIS — I495 Sick sinus syndrome: Secondary | ICD-10-CM

## 2013-12-04 DIAGNOSIS — I10 Essential (primary) hypertension: Secondary | ICD-10-CM

## 2013-12-04 DIAGNOSIS — I4891 Unspecified atrial fibrillation: Secondary | ICD-10-CM

## 2013-12-04 DIAGNOSIS — Z950 Presence of cardiac pacemaker: Secondary | ICD-10-CM

## 2013-12-04 LAB — MDC_IDC_ENUM_SESS_TYPE_INCLINIC
Battery Voltage: 2.79 V
Brady Statistic AP VS Percent: 56.5 %
Brady Statistic AS VP Percent: 0.1 %
Lead Channel Pacing Threshold Amplitude: 0.5 V
Lead Channel Pacing Threshold Amplitude: 0.75 V
Lead Channel Pacing Threshold Pulse Width: 0.4 ms
Lead Channel Sensing Intrinsic Amplitude: 11.2 mV
Lead Channel Sensing Intrinsic Amplitude: 2 mV
Lead Channel Setting Pacing Amplitude: 2 V
Lead Channel Setting Pacing Amplitude: 2.5 V
Lead Channel Setting Pacing Pulse Width: 0.4 ms
Lead Channel Setting Sensing Sensitivity: 4 mV
MDC IDC MSMT BATTERY IMPEDANCE: 301 Ohm
MDC IDC MSMT LEADCHNL RA IMPEDANCE VALUE: 434 Ohm
MDC IDC MSMT LEADCHNL RA PACING THRESHOLD PULSEWIDTH: 0.4 ms
MDC IDC MSMT LEADCHNL RV IMPEDANCE VALUE: 491 Ohm
MDC IDC STAT BRADY AP VP PERCENT: 0.1 % — AB
MDC IDC STAT BRADY AS VS PERCENT: 43.3 %

## 2013-12-04 MED ORDER — ASPIRIN EC 325 MG PO TBEC: 325.0000 mg | DELAYED_RELEASE_TABLET | Freq: Every day | ORAL | Status: AC

## 2013-12-04 NOTE — Progress Notes (Signed)
Steven PriestCarlton Callahan Date of Birth: 04/01/1921 Medical Record #[id number]  History of Present Illness: Steven PouCarlton is seen back today for a follow up visit. This is a 6 month check. Seen for Dr. Elease HashimotoNahser - former patient of Dr. Ronnald Nianennant's. He is now 80 years of age. He has CAD, remote CABG, DM, HTN, anemia (on Aranesp), aortic insufficiency and paroxysmal atrial fib. He has a pacemaker for tachybrady. He is on chronic Xarelto. Has a known left ICA occlusion and is followed by [facility]. Has not tolerated amiodarone in the past. Followed by Dr. Timothy Lassousso in primary care. Sees Dr. Johney FrameAllred for his device.   Aspirin was stopped at a prior visit due to issues with his anemia.   Seen back in December of 2014. He was doing ok. He did not wish to have his repeat renal duplex or abdominal ultrasound. He has opted for conservative management.   Comes in today. Here with Steven AasDoris, his wife. Says he is doing about the same. No chest pain. He is still not interested in having any more tests or procedures. Not short of breath. No falls.  Mostly complaining of a thumping in his ears - no matter what side he lies on - has to use a fan at night to sleep. Feels like his ears are stopped up - has seen ENT in the past. If he really exerts he will note a fast heart beat and feel a "little something in his chest". But overall, he denies having any chest pain.  Still mowing - still out in the heat of the day. BP still labile but sounds ok for the most part. Elevated today due to stress from not sleeping last night - no power - couldn't use his fan, etc. Not on Xarelto since Saturday - can't afford - ?intolerant to Pradaxa from indigestion. Not interested in Coumadin and having to do "all those checks". Does not want any further testing as he has stated before. Wife now giving him 2 aspirins at night.   Current Outpatient Prescriptions  Medication Sig Dispense Refill  . ALPRAZolam (XANAX) 0.5 MG tablet Take 0.5 mg by mouth at bedtime.        Marland Kitchen.  amLODipine (NORVASC) 10 MG tablet Take 1 tablet (10 mg total) by mouth daily.  90 tablet  3  . aspirin 325 MG tablet Take 650 mg by mouth at bedtime.      . bisacodyl (DULCOLAX) 5 MG EC tablet Take 5 mg by mouth daily as needed.        . cefdinir (OMNICEF) 300 MG capsule Take 150 mg by mouth daily.      . hydrALAZINE (APRESOLINE) 25 MG tablet Take 50 mg by mouth 3 (three) times daily as needed.       . isosorbide mononitrate (IMDUR) 60 MG 24 hr tablet Take 60 mg by mouth daily.        . metoprolol succinate (TOPROL-XL) 50 MG 24 hr tablet TAKE 1 TABLET (50 MG TOTAL) BY MOUTH DAILY.  90 tablet  1  . nitroGLYCERIN (NITROSTAT) 0.4 MG SL tablet Place 1 tablet (0.4 mg total) under the tongue every 5 (five) minutes as needed.  25 tablet  5  . simvastatin (ZOCOR) 20 MG tablet Take 20 mg by mouth daily.       . TRADJENTA 5 MG TABS tablet Take 5 mg by mouth daily.       . traMADol (ULTRAM) 50 MG tablet Take 50 mg by mouth every 8 (eight) hours  as needed.      . Rivaroxaban (XARELTO) 15 MG TABS tablet TAKE 1 TABLET (15 MG TOTAL) BY MOUTH DAILY.  30 tablet  6   No current facility-administered medications for this visit.    Allergies  Allergen Reactions  . Ibuprofen Hives    Past Medical History  Diagnosis Date  . Murmur   . 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 HTN (hypertension)   . Carotid artery disease     TOTALLY OCCLUDED LEFT CAROTID  . Coronary artery disease   . CAD (coronary artery disease) 1981  . Atrial fibrillation   . GERD (gastroesophageal reflux disease)   . Anemia     on arinesp  . AAA (abdominal aortic aneurysm)   . CHF (congestive heart failure)   . Diabetes mellitus without complication   . Myocardial infarction     80 yrs old  . Ear problem     left ear. Hear a roaring feels like it is stopped up    Past Surgical History  Procedure Laterality Date  . Nasal septum surgery    . Cataract extraction, bilateral    . Coronary artery  bypass graft  1999  . Pacemaker insertion  07/08/10    MDT pacemaker by Dr Deborah Chalk for tachy/brady syndrome    History  Smoking status  . Former Smoker -- 1.00 packs/day  . Types: Cigarettes  . Quit date: 09/05/1980  Smokeless tobacco  . Never Used    History  Alcohol Use No    Family History  Problem Relation Age of Onset  . Stroke Father   . Bone cancer Mother   . Cancer Mother   . Coronary artery disease Child   . Coronary artery disease Child   . Cancer Sister   . Heart disease Daughter     Heart Disease before age 51  . Heart disease Son     Heart Disease before age 78-  AAA    Review of Systems: The review of systems is per the HPI.  All other systems were reviewed and are negative.  Physical Exam: BP 180/30  Pulse 66  Ht 5\' 11"  (1.803 m)  Wt 162 lb 1.9 oz (73.537 kg)  BMI 22.62 kg/m2  SpO2 98% Patient is very pleasant and in no acute distress. He is thin.  Skin is warm and dry. Color is normal.  HEENT is unremarkable. Normocephalic/atraumatic. PERRL. Sclera are nonicteric. Neck is supple. No masses. No JVD. Lungs are clear. Cardiac exam shows a regular rate and rhythm. Harsh holosytolic murmur. Abdomen is soft. Extremities are without edema. Gait and ROM are intact. No gross neurologic deficits noted.  Wt Readings from Last 3 Encounters:  12/04/13 162 lb 1.9 oz (73.537 kg)  08/05/13 160 lb (72.576 kg)  07/17/13 160 lb (72.576 kg)     LABORATORY DATA:  Pacer is checked - less than 1% AF burden  Lab Results  Component Value Date   WBC 6.0 11/11/2013   HGB 10.6* 11/11/2013   HCT 32.9* 11/11/2013   PLT 263 11/11/2013   GLUCOSE 206* 07/06/2011   CHOL  Value: 119        ATP III CLASSIFICATION:  <200     mg/dL   Desirable  409-811  mg/dL   Borderline High  >=914    mg/dL   High        7/82/9562   TRIG 54 07/06/2010   HDL 48 07/06/2010   LDLCALC  Value: 60        Total Cholesterol/HDL:CHD Risk Coronary Heart Disease Risk Table                     Men   Women   1/2 Average Risk   3.4   3.3  Average Risk       5.0   4.4  2 X Average Risk   9.6   7.1  3 X Average Risk  23.4   11.0        Use the calculated Patient Ratio above and the CHD Risk Table to determine the patient's CHD Risk.        ATP III CLASSIFICATION (LDL):  <100     mg/dL   Optimal  761-950  mg/dL   Near or Above                    Optimal  130-159  mg/dL   Borderline  932-671  mg/dL   High  >245     mg/dL   Very High 02/03/9832   ALT 7 10/28/2010   AST 11 10/28/2010   NA 141 07/06/2011   K 5.1 07/06/2011   CL 106 07/06/2011   CREATININE 1.70* 07/06/2011   BUN 32* 07/06/2011   CO2 27 07/06/2011   TSH 0.369 07/06/2010   INR 1.29 07/06/2011   HGBA1C  Value: 7.2 (NOTE)                                                                       According to the ADA Clinical Practice Recommendations for 2011, when HbA1c is used as a screening test:   >=6.5%   Diagnostic of Diabetes Mellitus           (if abnormal result  is confirmed)  5.7-6.4%   Increased risk of developing Diabetes Mellitus  References:Diagnosis and Classification of Diabetes Mellitus,Diabetes Care,2011,34(Suppl 1):S62-S69 and Standards of Medical Care in         Diabetes - 2011,Diabetes Care,2011,34  (Suppl 1):S11-S61.* 07/06/2010    Assessment / Plan: 1. CAD - remote CABG - managed medically.   2. Labile HTN - had renal artery stenosis noted on scan from 2013 - he is not interested in further testing even after lengthy discussion again today.   3. Tachybrady - with PPM in place - checked today at our visit - still with 8 years of battery life  4. PAF - not able to afford Xarelto - I have talked with the pharmacist Riki Rusk here - unfortunately cost will be an issue with all NOACs - keeping in samples cannot be guaranteed for the next 6 months. Will keep on aspirin - he understands that this is not ideal therapy but he is adamant about saving money and is ready "to die" anytime.  I will see him back in 6 months with a pacer check. He remains quite an  active 80 year old in my opinion.   Patient is agreeable to this plan and will call if any problems develop in the interim.   Rosalio Macadamia, RN, ANP-C  [facility]  [address]  [place], [place] [address]

## 2013-12-04 NOTE — Patient Instructions (Addendum)
Stay on your current medicines but we will not restart the Xarelto - stay on one aspirin each day  Stay safe and active  I will see you in 6 months with a pacer check  Call the [facility] office at [phone] if you have any questions, problems or concerns.

## 2013-12-09 ENCOUNTER — Encounter (HOSPITAL_COMMUNITY): Payer: Medicare Other

## 2013-12-16 ENCOUNTER — Encounter (HOSPITAL_COMMUNITY)
Admission: RE | Admit: 2013-12-16 | Discharge: 2013-12-16 | Disposition: A | Discharge status: Home / Self Care | Payer: Medicare Other | Source: Ambulatory Visit | Attending: Internal Medicine | Admitting: Internal Medicine

## 2013-12-16 ENCOUNTER — Encounter (HOSPITAL_COMMUNITY): Payer: Self-pay

## 2013-12-16 DIAGNOSIS — D649 Anemia, unspecified: Secondary | ICD-10-CM | POA: Insufficient documentation

## 2013-12-16 LAB — CBC WITH DIFFERENTIAL/PLATELET
Basophils Absolute: 0.1 10*3/uL (ref 0.0–0.1)
Basophils Relative: 1 % (ref 0–1)
Eosinophils Absolute: 0.2 10*3/uL (ref 0.0–0.7)
Eosinophils Relative: 3 % (ref 0–5)
HCT: 35.7 % — ABNORMAL LOW (ref 39.0–52.0)
Hemoglobin: 11.4 g/dL — ABNORMAL LOW (ref 13.0–17.0)
Lymphocytes Relative: 22 % (ref 12–46)
Lymphs Abs: 1.6 10*3/uL (ref 0.7–4.0)
MCH: 25.7 pg — AB (ref 26.0–34.0)
MCHC: 31.9 g/dL (ref 30.0–36.0)
MCV: 80.6 fL (ref 78.0–100.0)
Monocytes Absolute: 0.7 10*3/uL (ref 0.1–1.0)
Monocytes Relative: 10 % (ref 3–12)
NEUTROS PCT: 64 % (ref 43–77)
Neutro Abs: 4.7 10*3/uL (ref 1.7–7.7)
PLATELETS: 241 10*3/uL (ref 150–400)
RBC: 4.43 MIL/uL (ref 4.22–5.81)
RDW: 15.4 % (ref 11.5–15.5)
WBC: 7.2 10*3/uL (ref 4.0–10.5)

## 2013-12-16 NOTE — Progress Notes (Signed)
Pacemaker check in clinic. Normal device function. Thresholds, sensing, impedances consistent with previous measurements. Device programmed to maximize longevity. 6 mode switch episodes (<0.1%)---max dur. 9 sec, Max A 150, Max V 171---AT. 1 high ventricular rate noted x 5 sec @ 87/197---AT. Device programmed at appropriate safety margins. Histogram distribution appropriate for patient activity level. Device programmed to optimize intrinsic conduction. Estimated longevity 8.5 years. Patient will follow up with Norma FredricksonLori Gerhardt, NP + pacemaker check on 12-8.

## 2013-12-30 ENCOUNTER — Encounter (HOSPITAL_COMMUNITY): Payer: Medicare Other

## 2014-01-01 ENCOUNTER — Encounter: Payer: Self-pay | Admitting: Internal Medicine

## 2014-01-07 ENCOUNTER — Encounter (INDEPENDENT_AMBULATORY_CARE_PROVIDER_SITE_OTHER): Payer: Medicare Other | Admitting: Ophthalmology

## 2014-01-07 DIAGNOSIS — H3530 Unspecified macular degeneration: Secondary | ICD-10-CM

## 2014-01-07 DIAGNOSIS — H353290 Exudative age-related macular degeneration, unspecified eye, stage unspecified: Secondary | ICD-10-CM

## 2014-01-07 DIAGNOSIS — H43819 Vitreous degeneration, unspecified eye: Secondary | ICD-10-CM

## 2014-01-07 DIAGNOSIS — H35039 Hypertensive retinopathy, unspecified eye: Secondary | ICD-10-CM

## 2014-01-07 DIAGNOSIS — I10 Essential (primary) hypertension: Secondary | ICD-10-CM

## 2014-01-15 ENCOUNTER — Other Ambulatory Visit: Payer: Self-pay | Admitting: Cardiovascular Disease

## 2014-01-20 ENCOUNTER — Encounter (HOSPITAL_COMMUNITY)
Admission: RE | Admit: 2014-01-20 | Discharge: 2014-01-20 | Disposition: A | Discharge status: Home / Self Care | Payer: Medicare Other | Source: Ambulatory Visit | Attending: Internal Medicine | Admitting: Internal Medicine

## 2014-01-20 ENCOUNTER — Encounter (HOSPITAL_COMMUNITY): Payer: Self-pay

## 2014-01-20 DIAGNOSIS — D649 Anemia, unspecified: Secondary | ICD-10-CM | POA: Insufficient documentation

## 2014-01-20 LAB — CBC
HEMATOCRIT: 35 % — AB (ref 39.0–52.0)
HEMOGLOBIN: 11.1 g/dL — AB (ref 13.0–17.0)
MCH: 26.1 pg (ref 26.0–34.0)
MCHC: 31.7 g/dL (ref 30.0–36.0)
MCV: 82.2 fL (ref 78.0–100.0)
Platelets: 206 10*3/uL (ref 150–400)
RBC: 4.26 MIL/uL (ref 4.22–5.81)
RDW: 15.6 % — AB (ref 11.5–15.5)
WBC: 6.5 10*3/uL (ref 4.0–10.5)

## 2014-01-31 ENCOUNTER — Other Ambulatory Visit: Payer: Self-pay | Admitting: Internal Medicine

## 2014-02-03 ENCOUNTER — Encounter (INDEPENDENT_AMBULATORY_CARE_PROVIDER_SITE_OTHER): Payer: Medicare Other | Admitting: Ophthalmology

## 2014-02-03 DIAGNOSIS — H353290 Exudative age-related macular degeneration, unspecified eye, stage unspecified: Secondary | ICD-10-CM

## 2014-02-03 DIAGNOSIS — I10 Essential (primary) hypertension: Secondary | ICD-10-CM

## 2014-02-03 DIAGNOSIS — H35039 Hypertensive retinopathy, unspecified eye: Secondary | ICD-10-CM

## 2014-02-03 DIAGNOSIS — H3530 Unspecified macular degeneration: Secondary | ICD-10-CM

## 2014-02-03 DIAGNOSIS — H43819 Vitreous degeneration, unspecified eye: Secondary | ICD-10-CM

## 2014-02-24 ENCOUNTER — Encounter (HOSPITAL_COMMUNITY): Payer: Self-pay

## 2014-02-24 ENCOUNTER — Encounter (HOSPITAL_COMMUNITY)
Admission: RE | Admit: 2014-02-24 | Discharge: 2014-02-24 | Disposition: A | Discharge status: Home / Self Care | Payer: Medicare Other | Source: Ambulatory Visit | Attending: Internal Medicine | Admitting: Internal Medicine

## 2014-02-24 DIAGNOSIS — D649 Anemia, unspecified: Secondary | ICD-10-CM | POA: Diagnosis present

## 2014-02-24 LAB — CBC WITH DIFFERENTIAL/PLATELET
Basophils Absolute: 0.1 10*3/uL (ref 0.0–0.1)
Basophils Relative: 2 % — ABNORMAL HIGH (ref 0–1)
Eosinophils Absolute: 0.2 10*3/uL (ref 0.0–0.7)
Eosinophils Relative: 3 % (ref 0–5)
HEMATOCRIT: 33.8 % — AB (ref 39.0–52.0)
Hemoglobin: 11.1 g/dL — ABNORMAL LOW (ref 13.0–17.0)
LYMPHS ABS: 1.7 10*3/uL (ref 0.7–4.0)
LYMPHS PCT: 26 % (ref 12–46)
MCH: 26.7 pg (ref 26.0–34.0)
MCHC: 32.8 g/dL (ref 30.0–36.0)
MCV: 81.3 fL (ref 78.0–100.0)
MONO ABS: 0.6 10*3/uL (ref 0.1–1.0)
Monocytes Relative: 9 % (ref 3–12)
Neutro Abs: 4.1 10*3/uL (ref 1.7–7.7)
Neutrophils Relative %: 60 % (ref 43–77)
PLATELETS: 218 10*3/uL (ref 150–400)
RBC: 4.16 MIL/uL — AB (ref 4.22–5.81)
RDW: 15.7 % — ABNORMAL HIGH (ref 11.5–15.5)
WBC: 6.7 10*3/uL (ref 4.0–10.5)

## 2014-03-05 ENCOUNTER — Encounter (INDEPENDENT_AMBULATORY_CARE_PROVIDER_SITE_OTHER): Payer: Medicare Other | Admitting: Ophthalmology

## 2014-03-05 DIAGNOSIS — H353290 Exudative age-related macular degeneration, unspecified eye, stage unspecified: Secondary | ICD-10-CM

## 2014-03-05 DIAGNOSIS — I10 Essential (primary) hypertension: Secondary | ICD-10-CM

## 2014-03-05 DIAGNOSIS — H43819 Vitreous degeneration, unspecified eye: Secondary | ICD-10-CM

## 2014-03-05 DIAGNOSIS — H35039 Hypertensive retinopathy, unspecified eye: Secondary | ICD-10-CM

## 2014-03-05 DIAGNOSIS — H3530 Unspecified macular degeneration: Secondary | ICD-10-CM

## 2014-03-31 ENCOUNTER — Encounter (HOSPITAL_COMMUNITY)
Admission: RE | Admit: 2014-03-31 | Discharge: 2014-03-31 | Disposition: A | Discharge status: Home / Self Care | Payer: Medicare Other | Source: Ambulatory Visit | Attending: Internal Medicine | Admitting: Internal Medicine

## 2014-03-31 ENCOUNTER — Encounter (HOSPITAL_COMMUNITY): Payer: Self-pay

## 2014-03-31 DIAGNOSIS — D649 Anemia, unspecified: Secondary | ICD-10-CM | POA: Diagnosis present

## 2014-03-31 DIAGNOSIS — Z5181 Encounter for therapeutic drug level monitoring: Secondary | ICD-10-CM | POA: Insufficient documentation

## 2014-03-31 LAB — CBC
HEMATOCRIT: 33.3 % — AB (ref 39.0–52.0)
Hemoglobin: 10.8 g/dL — ABNORMAL LOW (ref 13.0–17.0)
MCH: 26.9 pg (ref 26.0–34.0)
MCHC: 32.4 g/dL (ref 30.0–36.0)
MCV: 83 fL (ref 78.0–100.0)
PLATELETS: 239 10*3/uL (ref 150–400)
RBC: 4.01 MIL/uL — ABNORMAL LOW (ref 4.22–5.81)
RDW: 15.4 % (ref 11.5–15.5)
WBC: 7.3 10*3/uL (ref 4.0–10.5)

## 2014-03-31 MED ORDER — DARBEPOETIN ALFA-POLYSORBATE 60 MCG/0.3ML IJ SOLN
60.0000 ug | INTRAMUSCULAR | Status: DC
  Administered 2014-03-31: 60 ug via SUBCUTANEOUS
  Filled 2014-03-31: qty 0.3

## 2014-03-31 NOTE — Discharge Instructions (Signed)
Darbepoetin Alfa injection What is this medicine? DARBEPOETIN ALFA (dar be POE e tin AL fa) helps your body make more red blood cells. It is used to treat anemia caused by chronic kidney failure and chemotherapy. This medicine may be used for other purposes; ask your health care provider or pharmacist if you have questions. COMMON BRAND NAME(S): Aranesp What should I tell my health care provider before I take this medicine? They need to know if you have any of these conditions: -blood clotting disorders or history of blood clots -cancer patient not on chemotherapy -cystic fibrosis -heart disease, such as angina, heart failure, or a history of a heart attack -hemoglobin level of 12 g/dL or greater -high blood pressure -low levels of folate, iron, or vitamin B12 -seizures -an unusual or allergic reaction to darbepoetin, erythropoietin, albumin, hamster proteins, latex, other medicines, foods, dyes, or preservatives -pregnant or trying to get pregnant -breast-feeding How should I use this medicine? This medicine is for injection into a vein or under the skin. It is usually given by a health care professional in a hospital or clinic setting. If you get this medicine at home, you will be taught how to prepare and give this medicine. Do not shake the solution before you withdraw a dose. Use exactly as directed. Take your medicine at regular intervals. Do not take your medicine more often than directed. It is important that you put your used needles and syringes in a special sharps container. Do not put them in a trash can. If you do not have a sharps container, call your pharmacist or healthcare provider to get one. Talk to your pediatrician regarding the use of this medicine in children. While this medicine may be used in children as young as 1 year for selected conditions, precautions do apply. Overdosage: If you think you have taken too much of this medicine contact a poison control center or  emergency room at once. NOTE: This medicine is only for you. Do not share this medicine with others. What if I miss a dose? If you miss a dose, take it as soon as you can. If it is almost time for your next dose, take only that dose. Do not take double or extra doses. What may interact with this medicine? Do not take this medicine with any of the following medications: -epoetin alfa This list may not describe all possible interactions. Give your health care provider a list of all the medicines, herbs, non-prescription drugs, or dietary supplements you use. Also tell them if you smoke, drink alcohol, or use illegal drugs. Some items may interact with your medicine. What should I watch for while using this medicine? Visit your prescriber or health care professional for regular checks on your progress and for the needed blood tests and blood pressure measurements. It is especially important for the doctor to make sure your hemoglobin level is in the desired range, to limit the risk of potential side effects and to give you the best benefit. Keep all appointments for any recommended tests. Check your blood pressure as directed. Ask your doctor what your blood pressure should be and when you should contact him or her. As your body makes more red blood cells, you may need to take iron, folic acid, or vitamin B supplements. Ask your doctor or health care provider which products are right for you. If you have kidney disease continue dietary restrictions, even though this medication can make you feel better. Talk with your doctor or health   care professional about the foods you eat and the vitamins that you take. What side effects may I notice from receiving this medicine? Side effects that you should report to your doctor or health care professional as soon as possible: -allergic reactions like skin rash, itching or hives, swelling of the face, lips, or tongue -breathing problems -changes in vision -chest  pain -confusion, trouble speaking or understanding -feeling faint or lightheaded, falls -high blood pressure -muscle aches or pains -pain, swelling, warmth in the leg -rapid weight gain -severe headaches -sudden numbness or weakness of the face, arm or leg -trouble walking, dizziness, loss of balance or coordination -seizures (convulsions) -swelling of the ankles, feet, hands -unusually weak or tired Side effects that usually do not require medical attention (report to your doctor or health care professional if they continue or are bothersome): -diarrhea -fever, chills (flu-like symptoms) -headaches -nausea, vomiting -redness, stinging, or swelling at site where injected This list may not describe all possible side effects. Call your doctor for medical advice about side effects. You may report side effects to FDA at 1-800-FDA-1088. Where should I keep my medicine? Keep out of the reach of children. Store in a refrigerator between 2 and 8 degrees C (36 and 46 degrees F). Do not freeze. Do not shake. Throw away any unused portion if using a single-dose vial. Throw away any unused medicine after the expiration date. NOTE: This sheet is a summary. It may not cover all possible information. If you have questions about this medicine, talk to your doctor, pharmacist, or health care provider.  2015, Elsevier/Gold Standard. (2008-05-27 10:23:57)  

## 2014-04-02 ENCOUNTER — Encounter (INDEPENDENT_AMBULATORY_CARE_PROVIDER_SITE_OTHER): Payer: Medicare Other | Admitting: Ophthalmology

## 2014-04-02 DIAGNOSIS — H35033 Hypertensive retinopathy, bilateral: Secondary | ICD-10-CM

## 2014-04-02 DIAGNOSIS — H43813 Vitreous degeneration, bilateral: Secondary | ICD-10-CM

## 2014-04-02 DIAGNOSIS — H3531 Nonexudative age-related macular degeneration: Secondary | ICD-10-CM

## 2014-04-02 DIAGNOSIS — H3532 Exudative age-related macular degeneration: Secondary | ICD-10-CM

## 2014-04-30 ENCOUNTER — Encounter (INDEPENDENT_AMBULATORY_CARE_PROVIDER_SITE_OTHER): Payer: Medicare Other | Admitting: Ophthalmology

## 2014-04-30 DIAGNOSIS — H3531 Nonexudative age-related macular degeneration: Secondary | ICD-10-CM

## 2014-04-30 DIAGNOSIS — I10 Essential (primary) hypertension: Secondary | ICD-10-CM

## 2014-04-30 DIAGNOSIS — H43813 Vitreous degeneration, bilateral: Secondary | ICD-10-CM

## 2014-04-30 DIAGNOSIS — H26491 Other secondary cataract, right eye: Secondary | ICD-10-CM

## 2014-04-30 DIAGNOSIS — H35033 Hypertensive retinopathy, bilateral: Secondary | ICD-10-CM

## 2014-04-30 DIAGNOSIS — H3532 Exudative age-related macular degeneration: Secondary | ICD-10-CM

## 2014-05-05 ENCOUNTER — Other Ambulatory Visit (HOSPITAL_COMMUNITY): Payer: Self-pay

## 2014-05-05 ENCOUNTER — Encounter (HOSPITAL_COMMUNITY): Payer: Self-pay

## 2014-05-05 ENCOUNTER — Encounter (HOSPITAL_COMMUNITY)
Admission: RE | Admit: 2014-05-05 | Discharge: 2014-05-05 | Disposition: A | Discharge status: Home / Self Care | Payer: Medicare Other | Source: Ambulatory Visit | Attending: Internal Medicine | Admitting: Internal Medicine

## 2014-05-05 DIAGNOSIS — D649 Anemia, unspecified: Secondary | ICD-10-CM | POA: Diagnosis present

## 2014-05-05 LAB — CBC
HCT: 36.8 % — ABNORMAL LOW (ref 39.0–52.0)
Hemoglobin: 11.6 g/dL — ABNORMAL LOW (ref 13.0–17.0)
MCH: 26.3 pg (ref 26.0–34.0)
MCHC: 31.5 g/dL (ref 30.0–36.0)
MCV: 83.4 fL (ref 78.0–100.0)
PLATELETS: 228 10*3/uL (ref 150–400)
RBC: 4.41 MIL/uL (ref 4.22–5.81)
RDW: 14.9 % (ref 11.5–15.5)
WBC: 7.5 10*3/uL (ref 4.0–10.5)

## 2014-05-15 ENCOUNTER — Other Ambulatory Visit (INDEPENDENT_AMBULATORY_CARE_PROVIDER_SITE_OTHER): Payer: Medicare Other | Admitting: Ophthalmology

## 2014-05-15 DIAGNOSIS — H26491 Other secondary cataract, right eye: Secondary | ICD-10-CM

## 2014-05-28 ENCOUNTER — Encounter (INDEPENDENT_AMBULATORY_CARE_PROVIDER_SITE_OTHER): Payer: Medicare Other | Admitting: Ophthalmology

## 2014-06-03 ENCOUNTER — Encounter: Payer: Self-pay | Admitting: Internal Medicine

## 2014-06-03 ENCOUNTER — Encounter: Payer: Self-pay | Admitting: Nurse Practitioner

## 2014-06-03 ENCOUNTER — Ambulatory Visit (INDEPENDENT_AMBULATORY_CARE_PROVIDER_SITE_OTHER): Payer: Medicare Other | Admitting: *Deleted

## 2014-06-03 ENCOUNTER — Ambulatory Visit (INDEPENDENT_AMBULATORY_CARE_PROVIDER_SITE_OTHER): Payer: Medicare Other | Admitting: Nurse Practitioner

## 2014-06-03 VITALS — BP 150/20 | HR 62 | Ht 71.0 in | Wt 163.4 lb

## 2014-06-03 DIAGNOSIS — I480 Paroxysmal atrial fibrillation: Secondary | ICD-10-CM

## 2014-06-03 DIAGNOSIS — I495 Sick sinus syndrome: Secondary | ICD-10-CM

## 2014-06-03 DIAGNOSIS — Z950 Presence of cardiac pacemaker: Secondary | ICD-10-CM

## 2014-06-03 DIAGNOSIS — I2510 Atherosclerotic heart disease of native coronary artery without angina pectoris: Secondary | ICD-10-CM

## 2014-06-03 DIAGNOSIS — I10 Essential (primary) hypertension: Secondary | ICD-10-CM

## 2014-06-03 LAB — MDC_IDC_ENUM_SESS_TYPE_INCLINIC
Battery Impedance: 377 Ohm
Battery Remaining Longevity: 95 mo
Battery Voltage: 2.79 V
Brady Statistic AP VP Percent: 0 %
Brady Statistic AP VS Percent: 67 %
Brady Statistic AS VP Percent: 0 %
Brady Statistic AS VS Percent: 33 %
Date Time Interrogation Session: 20151208172122
Lead Channel Impedance Value: 440 Ohm
Lead Channel Impedance Value: 501 Ohm
Lead Channel Pacing Threshold Amplitude: 0.5 V
Lead Channel Pacing Threshold Amplitude: 1 V
Lead Channel Pacing Threshold Pulse Width: 0.4 ms
Lead Channel Pacing Threshold Pulse Width: 0.4 ms
Lead Channel Sensing Intrinsic Amplitude: 11.2 mV
Lead Channel Sensing Intrinsic Amplitude: 2 mV
Lead Channel Setting Pacing Amplitude: 2 V
Lead Channel Setting Pacing Amplitude: 2.5 V
Lead Channel Setting Pacing Pulse Width: 0.4 ms
Lead Channel Setting Sensing Sensitivity: 4 mV

## 2014-06-03 MED ORDER — ISOSORBIDE MONONITRATE ER 60 MG PO TB24: 90.0000 mg | ORAL_TABLET | Freq: Every day | ORAL | Status: AC

## 2014-06-03 NOTE — Progress Notes (Signed)
Steven Callahan Date of Birth: 04/15/1921 Medical Record #[id number]  History of Present Illness: Steven Callahan is seen back today for a follow up visit. This is a 6 month check. Seen for Dr. Elease HashimotoNahser - former patient of Dr. Ronnald Nianennant's. He has just turned 80 years of age. He has CAD, remote CABG, DM, HTN, anemia (on Aranesp), aortic insufficiency and paroxysmal atrial fib. He has a pacemaker for tachybrady. He has been on  Xarelto in the past but did not want the expense - refused coumadin due to INRs - managed only on aspirin therapy. Has a known left ICA occlusion and is followed by [facility]. Has not tolerated amiodarone in the past. Followed by Dr. Timothy Lassousso in primary care. Sees Dr. Johney FrameAllred for his device.   Aspirin was stopped at a prior visit due to issues with his anemia.   Seen back in June of 2015. He was doing ok. He did not wish to have his repeat renal duplex or abdominal ultrasound. He has continued to opt for conservative management. Declines anticoagulation - NOAC cost prohibitive and he did not wish to have INRs checked - has remained on aspirin.   Saw Dr. Timothy Lassousso at the end of November - was doing ok but endorsed chest pain at night ?anxiety. Placed on PPI. NTG has helped ease his pain. EKG reported to be ok. Noted that they would consider adding trazodone at bedtime if PPI did not help.   Comes in today. Here with Steven Callahan, his wife. He has just turned 80. Remains pretty active. Notes that the chest pain at night has improved with the PPI therapy. Will still have some chest pain with walking during the day. His wife sees him rubbing his chest. No falls. He is "staggery". BP a little labile at home but overall is ok.   Current Outpatient Prescriptions  Medication Sig Dispense Refill  . ALPRAZolam (XANAX) 0.5 MG tablet Take 0.5 mg by mouth at bedtime.      Marland Kitchen. amLODipine (NORVASC) 10 MG tablet TAKE 1 TABLET (10 MG TOTAL) BY MOUTH DAILY. 90 tablet 3  . aspirin EC 325 MG tablet Take 1 tablet (325 mg total)  by mouth daily. 30 tablet 0  . bisacodyl (DULCOLAX) 5 MG EC tablet Take 5 mg by mouth daily as needed.      . hydrALAZINE (APRESOLINE) 25 MG tablet Take 50 mg by mouth 3 (three) times daily as needed.     . isosorbide mononitrate (IMDUR) 60 MG 24 hr tablet Take 60 mg by mouth daily.      . metoprolol succinate (TOPROL-XL) 50 MG 24 hr tablet TAKE 1 TABLET (50 MG TOTAL) BY MOUTH DAILY. 90 tablet 1  . nitroGLYCERIN (NITROSTAT) 0.4 MG SL tablet Place 1 tablet (0.4 mg total) under the tongue every 5 (five) minutes as needed. 25 tablet 5  . pantoprazole (PROTONIX) 40 MG tablet Take 40 mg by mouth daily.     . simvastatin (ZOCOR) 20 MG tablet Take 20 mg by mouth daily.     . traMADol (ULTRAM) 50 MG tablet Take 50 mg by mouth every 8 (eight) hours as needed.     No current facility-administered medications for this visit.    Allergies  Allergen Reactions  . Ibuprofen Hives    Past Medical History  Diagnosis Date  . Murmur   . 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 HTN (hypertension)   . Carotid artery disease     TOTALLY  OCCLUDED LEFT CAROTID  . Coronary artery disease   . CAD (coronary artery disease) 1981  . Atrial fibrillation   . GERD (gastroesophageal reflux disease)   . Anemia     on arinesp  . AAA (abdominal aortic aneurysm)   . CHF (congestive heart failure)   . Diabetes mellitus without complication   . Myocardial infarction     80 yrs old  . Ear problem     left ear. Hear a roaring feels like it is stopped up    Past Surgical History  Procedure Laterality Date  . Nasal septum surgery    . Cataract extraction, bilateral    . Coronary artery bypass graft  1999  . Pacemaker insertion  07/08/10    MDT pacemaker by Dr Deborah Chalk for tachy/brady syndrome    History  Smoking status  . Former Smoker -- 1.00 packs/day  . Types: Cigarettes  . Quit date: 09/05/1980  Smokeless tobacco  . Never Used    History  Alcohol Use No    Family  History  Problem Relation Age of Onset  . Stroke Father   . Bone cancer Mother   . Cancer Mother   . Coronary artery disease Child   . Coronary artery disease Child   . Cancer Sister   . Heart disease Daughter     Heart Disease before age 51  . Heart disease Son     Heart Disease before age 37-  AAA    Review of Systems: The review of systems is per the HPI.  All other systems were reviewed and are negative.  Physical Exam: BP 150/20 mmHg  Pulse 62  Ht 5\' 11"  (1.803 m)  Wt 163 lb 6.4 oz (74.118 kg)  BMI 22.80 kg/m2  SpO2 97%  BP 160/40 by me.  Patient is very pleasant and in no acute distress. Skin is warm and dry. Color is normal.  HEENT is unremarkable. Normocephalic/atraumatic. PERRL. Sclera are nonicteric. Neck is supple. No masses. No JVD. Lungs are clear. Cardiac exam shows a regular rate and rhythm. Loud outflow murmur noted. Abdomen is soft. Extremities are without edema. Gait and ROM are intact. No gross neurologic deficits noted.  Wt Readings from Last 3 Encounters:  06/03/14 163 lb 6.4 oz (74.118 kg)  02/24/14 160 lb (72.576 kg)  12/04/13 162 lb 1.9 oz (73.537 kg)    LABORATORY DATA/PROCEDURES:  Lab Results  Component Value Date   WBC 7.5 05/05/2014   HGB 11.6* 05/05/2014   HCT 36.8* 05/05/2014   PLT 228 05/05/2014   GLUCOSE 206* 07/06/2011   CHOL  07/06/2010    119        ATP III CLASSIFICATION:  <200     mg/dL   Desirable  161-096  mg/dL   Borderline High  >=045    mg/dL   High          TRIG 54 07/06/2010   HDL 48 07/06/2010   LDLCALC  07/06/2010    60        Total Cholesterol/HDL:CHD Risk Coronary Heart Disease Risk Table                     Men   Women  1/2 Average Risk   3.4   3.3  Average Risk       5.0   4.4  2 X Average Risk   9.6   7.1  3 X Average Risk  23.4   11.0  Use the calculated Patient Ratio above and the CHD Risk Table to determine the patient's CHD Risk.        ATP III CLASSIFICATION (LDL):  <100     mg/dL    Optimal  161-096100-129  mg/dL   Near or Above                    Optimal  130-159  mg/dL   Borderline  045-409160-189  mg/dL   High  >811>190     mg/dL   Very High   ALT 7 91/47/829505/08/2010   AST 11 10/28/2010   NA 141 07/06/2011   K 5.1 07/06/2011   CL 106 07/06/2011   CREATININE 1.70* 07/06/2011   BUN 32* 07/06/2011   CO2 27 07/06/2011   TSH 0.369 07/06/2010   INR 1.29 07/06/2011   HGBA1C * 07/06/2010    7.2 (NOTE)                                                                       According to the ADA Clinical Practice Recommendations for 2011, when HbA1c is used as a screening test:   >=6.5%   Diagnostic of Diabetes Mellitus           (if abnormal result  is confirmed)  5.7-6.4%   Increased risk of developing Diabetes Mellitus  References:Diagnosis and Classification of Diabetes Mellitus,Diabetes Care,2011,34(Suppl 1):S62-S69 and Standards of Medical Care in         Diabetes - 2011,Diabetes Care,2011,34  (Suppl 1):S11-S61.    BNP (last 3 results) No results for input(s): PROBNP in the last 8760 hours.   Assessment / Plan: 1. CAD - remote CABG - managed medically. Now on PPI for nocturnal angina - still with some issue during the day - he is agreeable to increasing the Imdur to 90 mg.  2. Labile HTN - had renal artery stenosis noted on scan from 2013 - he continues to not be interested in further testing even after lengthy discussion again today.  3. Tachybrady - with PPM in place - checked today at our visit - satisfactory function - check again in 6 months  4. PAF -  Will keep on aspirin - he understands that this is not ideal therapy but he is adamant about saving money and is ready "to die" anytime.  5. Known aortic insufficiency - still not interested in further testing.   Will increase his Imdur. I will see back as needed but plan to see in 6 months with a pacer check.   Patient is agreeable to this plan and will call if any problems develop in the interim.   Rosalio MacadamiaLori C. Despina Boan, RN,  ANP-C [facility] [address] [place], [place]  [id number] [phone]

## 2014-06-03 NOTE — Progress Notes (Signed)
Pacemaker check in clinic. Battery longevity 8 years. Normal device function. 3 atrial high rate episodes--longest was 10 seconds. 6 ventricular high rate episodes--longest was 19 seconds. ROV in 6 mths w/device clinic.

## 2014-06-03 NOTE — Patient Instructions (Addendum)
Stay on your current medicines but I am increasing your Imdur to 90 mg a day (tablet and a half) - [facility]  See me in 6 months  Let me know if you have any worsening of symptoms  Call the [facility] office at [phone] if you have any questions, problems or concerns.

## 2014-06-06 ENCOUNTER — Encounter: Payer: Self-pay | Admitting: Surgery

## 2014-06-09 ENCOUNTER — Encounter: Payer: Self-pay | Admitting: Surgery

## 2014-06-09 ENCOUNTER — Ambulatory Visit (INDEPENDENT_AMBULATORY_CARE_PROVIDER_SITE_OTHER): Payer: Medicare Other | Admitting: Surgery

## 2014-06-09 ENCOUNTER — Ambulatory Visit (HOSPITAL_COMMUNITY)
Admission: RE | Admit: 2014-06-09 | Discharge: 2014-06-09 | Disposition: A | Discharge status: Home / Self Care | Payer: Medicare Other | Source: Ambulatory Visit | Attending: Surgery | Admitting: Surgery

## 2014-06-09 VITALS — BP 145/33 | HR 73 | Ht 71.0 in | Wt 162.0 lb

## 2014-06-09 DIAGNOSIS — I6523 Occlusion and stenosis of bilateral carotid arteries: Secondary | ICD-10-CM

## 2014-06-09 DIAGNOSIS — I2510 Atherosclerotic heart disease of native coronary artery without angina pectoris: Secondary | ICD-10-CM

## 2014-06-09 NOTE — Progress Notes (Signed)
Patient name: Steven Callahan MRN: [id number] DOB: 03/14/1921 Sex: male     Chief Complaint  Patient presents with  . Re-evaluation    1 year f/u carotid    HISTORY OF PRESENT ILLNESS: This is a patient of Dr. Edilia Boickson so we have been following chronically for carotid stenosis.  The patient has a history of left internal carotid occlusion.  Since we last saw him, he has remained asymptomatic.  Past Medical History  Diagnosis Date  . Murmur   . 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 HTN (hypertension)   . Carotid artery disease     TOTALLY OCCLUDED LEFT CAROTID  . Coronary artery disease   . CAD (coronary artery disease) 1981  . Atrial fibrillation   . GERD (gastroesophageal reflux disease)   . Anemia     on arinesp  . AAA (abdominal aortic aneurysm)   . CHF (congestive heart failure)   . Diabetes mellitus without complication   . Myocardial infarction     80 yrs old  . Ear problem     left ear. Hear a roaring feels like it is stopped up    Past Surgical History  Procedure Laterality Date  . Nasal septum surgery    . Cataract extraction, bilateral    . Coronary artery bypass graft  1999  . Pacemaker insertion  07/08/10    MDT pacemaker by Dr Deborah Chalkennant for tachy/brady syndrome    History   Social History  . Marital Status: Married    Spouse Name: N/A    Number of Children: N/A  . Years of Education: N/A   Occupational History  . Not on file.   Social History Main Topics  . Smoking status: Former Smoker -- 1.00 packs/day    Types: Cigarettes    Quit date: 09/05/1980  . Smokeless tobacco: Never Used  . Alcohol Use: No  . Drug Use: No  . Sexual Activity: Not on file   Other Topics Concern  . Not on file   Social History Narrative    Family History  Problem Relation Age of Onset  . Stroke Father   . Bone cancer Mother   . Cancer Mother   . Coronary artery disease Child   . Coronary artery disease Child   . Cancer  Sister   . Heart disease Daughter     Heart Disease before age 78  . Heart disease Son     Heart Disease before age 560-  AAA    Allergies as of 06/09/2014 - Review Complete 06/09/2014  Allergen Reaction Noted  . Ibuprofen Hives 07/21/2010    Current Outpatient Prescriptions on File Prior to Visit  Medication Sig Dispense Refill  . ALPRAZolam (XANAX) 0.5 MG tablet Take 0.5 mg by mouth at bedtime.      Marland Kitchen. amLODipine (NORVASC) 10 MG tablet TAKE 1 TABLET (10 MG TOTAL) BY MOUTH DAILY. 90 tablet 3  . aspirin EC 325 MG tablet Take 1 tablet (325 mg total) by mouth daily. 30 tablet 0  . bisacodyl (DULCOLAX) 5 MG EC tablet Take 5 mg by mouth daily as needed.      . hydrALAZINE (APRESOLINE) 25 MG tablet Take 50 mg by mouth 3 (three) times daily as needed.     . isosorbide mononitrate (IMDUR) 60 MG 24 hr tablet Take 1.5 tablets (90 mg total) by mouth daily. 135 tablet 3  . metoprolol succinate (TOPROL-XL) 50 MG 24 hr  tablet TAKE 1 TABLET (50 MG TOTAL) BY MOUTH DAILY. 90 tablet 1  . nitroGLYCERIN (NITROSTAT) 0.4 MG SL tablet Place 1 tablet (0.4 mg total) under the tongue every 5 (five) minutes as needed. 25 tablet 5  . pantoprazole (PROTONIX) 40 MG tablet Take 40 mg by mouth daily.     . simvastatin (ZOCOR) 20 MG tablet Take 20 mg by mouth daily.     . traMADol (ULTRAM) 50 MG tablet Take 50 mg by mouth every 8 (eight) hours as needed.     No current facility-administered medications on file prior to visit.     REVIEW OF SYSTEMS: Cardiovascular: No chest pain, chest pressure, palpitations, orthopnea, or dyspnea on exertion. No claudication or rest pain,  No history of DVT or phlebitis. Pulmonary: No productive cough, asthma or wheezing. Neurologic: No weakness, paresthesias, aphasia, or amaurosis. No dizziness. Hematologic: No bleeding problems or clotting disorders. Musculoskeletal: No joint pain or joint swelling. Gastrointestinal: No blood in stool or hematemesis Genitourinary: No dysuria or  hematuria. Psychiatric:: No history of major depression. Integumentary: No rashes or ulcers. Constitutional: No fever or chills.   PHYSICAL EXAMINATION:   Vital signs are BP 145/33 mmHg  Pulse 73  Ht 5\' 11"  (1.803 m)  Wt 162 lb (73.483 kg)  BMI 22.60 kg/m2  SpO2 98% General: The patient appears their stated age. HEENT:  No gross abnormalities Pulmonary:  Non labored breathing Musculoskeletal: There are no major deformities. Neurologic: No focal weakness or paresthesias are detected, Skin: There are no ulcer or rashes noted. Psychiatric: The patient has normal affect. Cardiovascular: There is a regular rate and rhythm without significant murmur appreciated.  Positive left carotid bruit   Diagnostic Studies Ultrasound was ordered and reviewed.  This shows less than 40% right internal carotid stenosis.  Known left carotid occlusion  Assessment: Carotid occlusive disease Plan: The patient's ultrasound remains unchanged today.  He'll follow-up in one year.  Jorge NyV. Wells Katianna Mcclenney IV, M.D. [facility] [place] Office: [phone] Pager:  [phone]

## 2014-06-09 NOTE — Addendum Note (Signed)
Addended by: Sharee PimpleMCCHESNEY, Arval Brandstetter K on: 06/09/2014 04:44 PM   Modules accepted: Orders

## 2014-06-16 ENCOUNTER — Other Ambulatory Visit (HOSPITAL_COMMUNITY): Payer: Self-pay | Admitting: Internal Medicine

## 2014-06-16 ENCOUNTER — Encounter (HOSPITAL_COMMUNITY)
Admission: RE | Admit: 2014-06-16 | Discharge: 2014-06-16 | Disposition: A | Discharge status: Home / Self Care | Payer: Medicare Other | Source: Ambulatory Visit | Attending: Internal Medicine | Admitting: Internal Medicine

## 2014-06-16 ENCOUNTER — Encounter (HOSPITAL_COMMUNITY): Payer: Self-pay

## 2014-06-16 DIAGNOSIS — D649 Anemia, unspecified: Secondary | ICD-10-CM | POA: Insufficient documentation

## 2014-06-16 LAB — CBC
HCT: 35.5 % — ABNORMAL LOW (ref 39.0–52.0)
Hemoglobin: 11.2 g/dL — ABNORMAL LOW (ref 13.0–17.0)
MCH: 26.4 pg (ref 26.0–34.0)
MCHC: 31.5 g/dL (ref 30.0–36.0)
MCV: 83.5 fL (ref 78.0–100.0)
Platelets: 258 10*3/uL (ref 150–400)
RBC: 4.25 MIL/uL (ref 4.22–5.81)
RDW: 15.8 % — AB (ref 11.5–15.5)
WBC: 6.3 10*3/uL (ref 4.0–10.5)

## 2014-07-21 ENCOUNTER — Encounter (HOSPITAL_COMMUNITY): Admission: RE | Admit: 2014-07-21 | Payer: Medicare Other | Source: Ambulatory Visit

## 2014-08-25 ENCOUNTER — Encounter (HOSPITAL_COMMUNITY)
Admission: RE | Admit: 2014-08-25 | Discharge: 2014-08-25 | Disposition: A | Discharge status: Home / Self Care | Payer: Medicare Other | Source: Ambulatory Visit | Attending: Internal Medicine | Admitting: Internal Medicine

## 2014-08-25 ENCOUNTER — Encounter (HOSPITAL_COMMUNITY): Payer: Self-pay

## 2014-08-25 DIAGNOSIS — D649 Anemia, unspecified: Secondary | ICD-10-CM | POA: Diagnosis present

## 2014-08-25 DIAGNOSIS — D508 Other iron deficiency anemias: Secondary | ICD-10-CM

## 2014-08-25 LAB — CBC
HCT: 33.6 % — ABNORMAL LOW (ref 39.0–52.0)
HEMOGLOBIN: 10 g/dL — AB (ref 13.0–17.0)
MCH: 24.8 pg — AB (ref 26.0–34.0)
MCHC: 29.8 g/dL — ABNORMAL LOW (ref 30.0–36.0)
MCV: 83.2 fL (ref 78.0–100.0)
Platelets: 283 10*3/uL (ref 150–400)
RBC: 4.04 MIL/uL — ABNORMAL LOW (ref 4.22–5.81)
RDW: 14.8 % (ref 11.5–15.5)
WBC: 6.7 10*3/uL (ref 4.0–10.5)

## 2014-08-25 MED ORDER — DARBEPOETIN ALFA 60 MCG/0.3ML IJ SOSY
60.0000 ug | PREFILLED_SYRINGE | Freq: Once | INTRAMUSCULAR | Status: AC
Start: 2014-08-25 — End: 2014-08-25
  Administered 2014-08-25: 60 ug via SUBCUTANEOUS
  Filled 2014-08-25: qty 0.3

## 2014-09-29 ENCOUNTER — Encounter (HOSPITAL_COMMUNITY)
Admission: RE | Admit: 2014-09-29 | Discharge: 2014-09-29 | Disposition: A | Discharge status: Home / Self Care | Payer: Medicare Other | Source: Ambulatory Visit | Attending: Internal Medicine | Admitting: Internal Medicine

## 2014-09-29 ENCOUNTER — Encounter (HOSPITAL_COMMUNITY): Payer: Self-pay

## 2014-09-29 ENCOUNTER — Other Ambulatory Visit (HOSPITAL_COMMUNITY): Payer: Self-pay | Admitting: Internal Medicine

## 2014-09-29 DIAGNOSIS — D649 Anemia, unspecified: Secondary | ICD-10-CM | POA: Diagnosis not present

## 2014-09-29 DIAGNOSIS — D508 Other iron deficiency anemias: Secondary | ICD-10-CM

## 2014-09-29 LAB — CBC
HCT: 36 % — ABNORMAL LOW (ref 39.0–52.0)
HEMOGLOBIN: 10.7 g/dL — AB (ref 13.0–17.0)
MCH: 24.5 pg — AB (ref 26.0–34.0)
MCHC: 29.7 g/dL — AB (ref 30.0–36.0)
MCV: 82.4 fL (ref 78.0–100.0)
PLATELETS: 227 10*3/uL (ref 150–400)
RBC: 4.37 MIL/uL (ref 4.22–5.81)
RDW: 15.5 % (ref 11.5–15.5)
WBC: 6.3 10*3/uL (ref 4.0–10.5)

## 2014-09-29 MED ORDER — DARBEPOETIN ALFA 60 MCG/0.3ML IJ SOSY
60.0000 ug | PREFILLED_SYRINGE | INTRAMUSCULAR | Status: DC | PRN
  Administered 2014-09-29: 60 ug via SUBCUTANEOUS
  Filled 2014-09-29: qty 0.3

## 2014-11-03 ENCOUNTER — Encounter (HOSPITAL_COMMUNITY)
Admission: RE | Admit: 2014-11-03 | Discharge: 2014-11-03 | Disposition: A | Discharge status: Home / Self Care | Payer: Medicare Other | Source: Ambulatory Visit | Attending: Internal Medicine | Admitting: Internal Medicine

## 2014-11-03 ENCOUNTER — Encounter (HOSPITAL_COMMUNITY): Payer: Self-pay

## 2014-11-03 DIAGNOSIS — D508 Other iron deficiency anemias: Secondary | ICD-10-CM

## 2014-11-03 DIAGNOSIS — D649 Anemia, unspecified: Secondary | ICD-10-CM | POA: Insufficient documentation

## 2014-11-03 LAB — CBC
HEMATOCRIT: 35.9 % — AB (ref 39.0–52.0)
Hemoglobin: 11 g/dL — ABNORMAL LOW (ref 13.0–17.0)
MCH: 24.7 pg — ABNORMAL LOW (ref 26.0–34.0)
MCHC: 30.6 g/dL (ref 30.0–36.0)
MCV: 80.5 fL (ref 78.0–100.0)
Platelets: 238 10*3/uL (ref 150–400)
RBC: 4.46 MIL/uL (ref 4.22–5.81)
RDW: 16.4 % — ABNORMAL HIGH (ref 11.5–15.5)
WBC: 7.2 10*3/uL (ref 4.0–10.5)

## 2014-11-03 NOTE — Progress Notes (Signed)
Pt here for cbc and aranesp.  hgb 11.0 today.  Per orders did not give aranesp.  Orders states to inject aranesp if Hbg less than 11.  Pt d/c home ambulatory with wife.

## 2014-12-01 ENCOUNTER — Emergency Department (HOSPITAL_COMMUNITY): Payer: Medicare Other

## 2014-12-01 ENCOUNTER — Encounter (HOSPITAL_COMMUNITY): Payer: Self-pay | Admitting: Internal Medicine

## 2014-12-01 ENCOUNTER — Inpatient Hospital Stay (HOSPITAL_COMMUNITY)
Admission: EM | Admit: 2014-12-01 | Discharge: 2014-12-26 | DRG: 871 | Disposition: E | Payer: Medicare Other | Attending: Internal Medicine | Admitting: Internal Medicine

## 2014-12-01 DIAGNOSIS — I5033 Acute on chronic diastolic (congestive) heart failure: Secondary | ICD-10-CM | POA: Diagnosis present

## 2014-12-01 DIAGNOSIS — D649 Anemia, unspecified: Secondary | ICD-10-CM | POA: Diagnosis present

## 2014-12-01 DIAGNOSIS — E1121 Type 2 diabetes mellitus with diabetic nephropathy: Secondary | ICD-10-CM | POA: Diagnosis present

## 2014-12-01 DIAGNOSIS — I129 Hypertensive chronic kidney disease with stage 1 through stage 4 chronic kidney disease, or unspecified chronic kidney disease: Secondary | ICD-10-CM | POA: Diagnosis present

## 2014-12-01 DIAGNOSIS — I480 Paroxysmal atrial fibrillation: Secondary | ICD-10-CM | POA: Diagnosis present

## 2014-12-01 DIAGNOSIS — I495 Sick sinus syndrome: Secondary | ICD-10-CM | POA: Diagnosis present

## 2014-12-01 DIAGNOSIS — I701 Atherosclerosis of renal artery: Secondary | ICD-10-CM | POA: Diagnosis present

## 2014-12-01 DIAGNOSIS — J9601 Acute respiratory failure with hypoxia: Secondary | ICD-10-CM | POA: Diagnosis present

## 2014-12-01 DIAGNOSIS — Z950 Presence of cardiac pacemaker: Secondary | ICD-10-CM

## 2014-12-01 DIAGNOSIS — Z79899 Other long term (current) drug therapy: Secondary | ICD-10-CM | POA: Diagnosis not present

## 2014-12-01 DIAGNOSIS — E1122 Type 2 diabetes mellitus with diabetic chronic kidney disease: Secondary | ICD-10-CM | POA: Diagnosis present

## 2014-12-01 DIAGNOSIS — N183 Chronic kidney disease, stage 3 unspecified: Secondary | ICD-10-CM | POA: Diagnosis present

## 2014-12-01 DIAGNOSIS — Z951 Presence of aortocoronary bypass graft: Secondary | ICD-10-CM

## 2014-12-01 DIAGNOSIS — I25119 Atherosclerotic heart disease of native coronary artery with unspecified angina pectoris: Secondary | ICD-10-CM | POA: Diagnosis present

## 2014-12-01 DIAGNOSIS — N179 Acute kidney failure, unspecified: Secondary | ICD-10-CM | POA: Diagnosis present

## 2014-12-01 DIAGNOSIS — I509 Heart failure, unspecified: Secondary | ICD-10-CM | POA: Diagnosis not present

## 2014-12-01 DIAGNOSIS — J189 Pneumonia, unspecified organism: Secondary | ICD-10-CM | POA: Diagnosis present

## 2014-12-01 DIAGNOSIS — Z66 Do not resuscitate: Secondary | ICD-10-CM | POA: Diagnosis not present

## 2014-12-01 DIAGNOSIS — E875 Hyperkalemia: Secondary | ICD-10-CM | POA: Diagnosis present

## 2014-12-01 DIAGNOSIS — Z87891 Personal history of nicotine dependence: Secondary | ICD-10-CM | POA: Diagnosis not present

## 2014-12-01 DIAGNOSIS — I6522 Occlusion and stenosis of left carotid artery: Secondary | ICD-10-CM | POA: Diagnosis present

## 2014-12-01 DIAGNOSIS — Z7982 Long term (current) use of aspirin: Secondary | ICD-10-CM

## 2014-12-01 DIAGNOSIS — Z515 Encounter for palliative care: Secondary | ICD-10-CM

## 2014-12-01 DIAGNOSIS — K219 Gastro-esophageal reflux disease without esophagitis: Secondary | ICD-10-CM | POA: Diagnosis present

## 2014-12-01 DIAGNOSIS — R0602 Shortness of breath: Secondary | ICD-10-CM | POA: Diagnosis not present

## 2014-12-01 DIAGNOSIS — I214 Non-ST elevation (NSTEMI) myocardial infarction: Secondary | ICD-10-CM | POA: Diagnosis not present

## 2014-12-01 DIAGNOSIS — Z8249 Family history of ischemic heart disease and other diseases of the circulatory system: Secondary | ICD-10-CM | POA: Diagnosis not present

## 2014-12-01 DIAGNOSIS — A419 Sepsis, unspecified organism: Secondary | ICD-10-CM | POA: Diagnosis not present

## 2014-12-01 DIAGNOSIS — I469 Cardiac arrest, cause unspecified: Secondary | ICD-10-CM | POA: Diagnosis not present

## 2014-12-01 DIAGNOSIS — I714 Abdominal aortic aneurysm, without rupture: Secondary | ICD-10-CM | POA: Diagnosis present

## 2014-12-01 DIAGNOSIS — Z886 Allergy status to analgesic agent status: Secondary | ICD-10-CM | POA: Diagnosis not present

## 2014-12-01 DIAGNOSIS — I252 Old myocardial infarction: Secondary | ICD-10-CM | POA: Diagnosis not present

## 2014-12-01 DIAGNOSIS — G459 Transient cerebral ischemic attack, unspecified: Secondary | ICD-10-CM

## 2014-12-01 LAB — I-STAT ARTERIAL BLOOD GAS, ED
Acid-base deficit: 6 mmol/L — ABNORMAL HIGH (ref 0.0–2.0)
Bicarbonate: 19.3 mEq/L — ABNORMAL LOW (ref 20.0–24.0)
O2 Saturation: 94 %
PCO2 ART: 34.8 mmHg — AB (ref 35.0–45.0)
Patient temperature: 98.6
TCO2: 20 mmol/L (ref 0–100)
pH, Arterial: 7.353 (ref 7.350–7.450)
pO2, Arterial: 74 mmHg — ABNORMAL LOW (ref 80.0–100.0)

## 2014-12-01 LAB — CBC
HEMATOCRIT: 35.5 % — AB (ref 39.0–52.0)
Hemoglobin: 11.2 g/dL — ABNORMAL LOW (ref 13.0–17.0)
MCH: 25.3 pg — AB (ref 26.0–34.0)
MCHC: 31.5 g/dL (ref 30.0–36.0)
MCV: 80.1 fL (ref 78.0–100.0)
PLATELETS: 212 10*3/uL (ref 150–400)
RBC: 4.43 MIL/uL (ref 4.22–5.81)
RDW: 17.5 % — ABNORMAL HIGH (ref 11.5–15.5)
WBC: 11.1 10*3/uL — AB (ref 4.0–10.5)

## 2014-12-01 LAB — COMPREHENSIVE METABOLIC PANEL
ALBUMIN: 3.4 g/dL — AB (ref 3.5–5.0)
ALT: 10 U/L — ABNORMAL LOW (ref 17–63)
ANION GAP: 7 (ref 5–15)
AST: 21 U/L (ref 15–41)
Alkaline Phosphatase: 52 U/L (ref 38–126)
BILIRUBIN TOTAL: 0.5 mg/dL (ref 0.3–1.2)
BUN: 39 mg/dL — ABNORMAL HIGH (ref 6–20)
CO2: 22 mmol/L (ref 22–32)
CREATININE: 2.58 mg/dL — AB (ref 0.61–1.24)
Calcium: 8.1 mg/dL — ABNORMAL LOW (ref 8.9–10.3)
Chloride: 105 mmol/L (ref 101–111)
GFR calc Af Amer: 23 mL/min — ABNORMAL LOW (ref >60)
GFR calc non Af Amer: 20 mL/min — ABNORMAL LOW (ref >60)
GLUCOSE: 221 mg/dL — AB (ref 65–99)
Potassium: 5.7 mmol/L — ABNORMAL HIGH (ref 3.5–5.1)
Sodium: 134 mmol/L — ABNORMAL LOW (ref 135–145)
Total Protein: 6.3 g/dL — ABNORMAL LOW (ref 6.5–8.1)

## 2014-12-01 LAB — I-STAT CHEM 8, ED
BUN: 38 mg/dL — AB (ref 6–20)
CALCIUM ION: 1.12 mmol/L — AB (ref 1.13–1.30)
CHLORIDE: 104 mmol/L (ref 101–111)
Creatinine, Ser: 2.5 mg/dL — ABNORMAL HIGH (ref 0.61–1.24)
Glucose, Bld: 220 mg/dL — ABNORMAL HIGH (ref 65–99)
HCT: 37 % — ABNORMAL LOW (ref 39.0–52.0)
Hemoglobin: 12.6 g/dL — ABNORMAL LOW (ref 13.0–17.0)
POTASSIUM: 5.6 mmol/L — AB (ref 3.5–5.1)
SODIUM: 134 mmol/L — AB (ref 135–145)
TCO2: 20 mmol/L (ref 0–100)

## 2014-12-01 LAB — DIFFERENTIAL
BASOS PCT: 1 % (ref 0–1)
Basophils Absolute: 0.1 10*3/uL (ref 0.0–0.1)
EOS PCT: 1 % (ref 0–5)
Eosinophils Absolute: 0.1 10*3/uL (ref 0.0–0.7)
Lymphocytes Relative: 10 % — ABNORMAL LOW (ref 12–46)
Lymphs Abs: 1.2 10*3/uL (ref 0.7–4.0)
MONOS PCT: 10 % (ref 3–12)
Monocytes Absolute: 1.1 10*3/uL — ABNORMAL HIGH (ref 0.1–1.0)
NEUTROS PCT: 78 % — AB (ref 43–77)
Neutro Abs: 8.7 10*3/uL — ABNORMAL HIGH (ref 1.7–7.7)

## 2014-12-01 LAB — I-STAT TROPONIN, ED: Troponin i, poc: 5.39 ng/mL (ref 0.00–0.08)

## 2014-12-01 LAB — I-STAT CG4 LACTIC ACID, ED: Lactic Acid, Venous: 1.31 mmol/L (ref 0.5–2.0)

## 2014-12-01 LAB — CBG MONITORING, ED: Glucose-Capillary: 208 mg/dL — ABNORMAL HIGH (ref 65–99)

## 2014-12-01 LAB — PROTIME-INR
INR: 1.13 (ref 0.00–1.49)
PROTHROMBIN TIME: 14.7 s (ref 11.6–15.2)

## 2014-12-01 LAB — APTT: aPTT: 32 seconds (ref 24–37)

## 2014-12-01 MED ORDER — LEVOFLOXACIN 500 MG PO TABS
500.0000 mg | ORAL_TABLET | Freq: Once | ORAL | Status: AC
  Administered 2014-12-01: 500 mg via ORAL
  Filled 2014-12-01: qty 1

## 2014-12-01 MED ORDER — PANTOPRAZOLE SODIUM 40 MG PO TBEC: 40.0000 mg | DELAYED_RELEASE_TABLET | Freq: Every day | ORAL | Status: DC

## 2014-12-01 MED ORDER — MORPHINE SULFATE 2 MG/ML IJ SOLN
2.0000 mg | INTRAMUSCULAR | Status: DC | PRN
  Administered 2014-12-01: 2 mg via INTRAVENOUS
  Filled 2014-12-01: qty 1

## 2014-12-01 MED ORDER — LORAZEPAM 2 MG/ML IJ SOLN
0.5000 mg | Freq: Four times a day (QID) | INTRAMUSCULAR | Status: DC | PRN
  Administered 2014-12-01: 0.5 mg via INTRAVENOUS
  Filled 2014-12-01: qty 1

## 2014-12-01 MED ORDER — SCOPOLAMINE 1 MG/3DAYS TD PT72
1.0000 | MEDICATED_PATCH | TRANSDERMAL | Status: DC
  Filled 2014-12-01: qty 1

## 2014-12-01 MED ORDER — DEXTROSE 5 % IV SOLN
2.0000 g | INTRAVENOUS | Status: DC
  Administered 2014-12-01: 2 g via INTRAVENOUS
  Filled 2014-12-01: qty 2

## 2014-12-01 MED ORDER — FUROSEMIDE 10 MG/ML IJ SOLN
80.0000 mg | Freq: Once | INTRAMUSCULAR | Status: AC
  Administered 2014-12-01: 80 mg via INTRAVENOUS
  Filled 2014-12-01: qty 8

## 2014-12-05 ENCOUNTER — Telehealth: Payer: Self-pay | Admitting: Cardiovascular Disease

## 2014-12-05 NOTE — Telephone Encounter (Signed)
[facility] picked up d/c.

## 2014-12-05 NOTE — Telephone Encounter (Signed)
[employer] dropped off d/c on patient, Dr.Cooper signed [facility] aware ready for pick up.  [phone]

## 2014-12-08 ENCOUNTER — Encounter (HOSPITAL_COMMUNITY): Payer: Medicare Other

## 2014-12-10 ENCOUNTER — Ambulatory Visit: Payer: Medicare Other | Admitting: Nurse Practitioner

## 2014-12-26 NOTE — Consult Note (Signed)
Consultation Note Date: 12/05/2014   Patient Name: Steven Callahan  DOB: 04/07/1921  MRN: [id number]  Age / Sex: 81 y.o., male   PCP: Creola CornJohn Russo, MD Referring Physician: Vassie Lollarlos Madera, MD  Reason for Consultation: Terminal care and Other  Palliative Care Assessment and Plan Summary of Established Goals of Care and Medical Treatment Preferences   Clinical Assessment/Narrative: 81 yo gentleman with advanced CHF, CAD and A. Fib admitted with weakness, SOB, Nausea and probable MI. Called for urgent Symptom Management consult. Chart reviewed in detail. Family meeting with Dr. Excell Seltzerooper when I arrived- during this time Steven Callahan expired unexpectedly and with little warning other than his presenting symptoms and very abnormal lab work up indicating MI and CHF along with PNA. Family Notified and I found them outside in hallway of POD A actively grieving with excellent support from chaplain. I accompanied them down to the viewing room, supported them in their acute grief response and also conferenced with them on probable cause of death and his very serious medical problems.  Contacts/Participants in Discussion: Wife of 70 years present, daughter, son, granddaughter, and DIL. Code Status/Advance Care Planning:  Patient was made DNR just prior to his cardiac arrest.  Symptom Management:   Patient received Morphine and Ativan for comfort.  Palliative Prophylaxis: NA  Additional Recommendations (Limitations, Scope, Preferences):  None Psycho-social/Spiritual:   Support System: Strong-recommended grief support with local Hospice   Desire for further Chaplaincy support:yes  Prognosis: Patient expired prior to completion of consultation  [facility]       Chief Complaint/History of Present Illness: Weakness, Dyspnea  Primary Diagnoses  Present on Admission:  . CAP (community acquired pneumonia) . Acute respiratory failure with  hypoxia . Tachy-brady syndrome . Acute renal failure superimposed on stage 3 chronic kidney disease . Hyperkalemia . Sepsis . Type 2 diabetes with nephropathy . GERD (gastroesophageal reflux disease)  Palliative Review of Systems: NA I have reviewed the medical record, interviewed the patient and family, and examined the patient. The following aspects are pertinent.  Past Medical History  Diagnosis Date  . Murmur   . 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 HTN (hypertension)   . Carotid artery disease     TOTALLY OCCLUDED LEFT CAROTID  . Coronary artery disease   . CAD (coronary artery disease) 1981  . Atrial fibrillation   . GERD (gastroesophageal reflux disease)   . Anemia     on arinesp  . AAA (abdominal aortic aneurysm)   . CHF (congestive heart failure)   . Diabetes mellitus without complication   . Myocardial infarction     81 yrs old  . Ear problem     left ear. Hear a roaring feels like it is stopped up   History   Social History  . Marital Status: Married    Spouse Name: N/A  . Number of Children: N/A  . Years of Education: N/A   Social History Main Topics  . Smoking status: Former Smoker -- 1.00 packs/day    Types: Cigarettes    Quit date: 09/05/1980  . Smokeless tobacco: Never Used  . Alcohol Use: No  . Drug Use: No  . Sexual Activity: Not on file   Other Topics Concern  . None   Social History Narrative   Family History  Problem Relation Age of Onset  . Stroke Father   . Bone cancer Mother   .  Cancer Mother   . Coronary artery disease Child   . Coronary artery disease Child   . Cancer Sister   . Heart disease Daughter     Heart Disease before age 45  . Heart disease Son     Heart Disease before age 65-  AAA   Scheduled Meds: . pantoprazole  40 mg Oral Q1200  . scopolamine  1 patch Transdermal Q72H   Continuous Infusions: . cefTRIAXone (ROCEPHIN)  IV Stopped (Dec 09, 2014 1145)   PRN Meds:.LORazepam,  morphine injection Medications Prior to Admission:  Prior to Admission medications   Medication Sig Start Date End Date Taking? Authorizing Provider  ALPRAZolam Prudy Feeler) 0.5 MG tablet Take 0.5 mg by mouth at bedtime.      Historical Provider, MD  amLODipine (NORVASC) 5 MG tablet Take 5 mg by mouth 2 (two) times daily. 09/08/14   Historical Provider, MD  aspirin EC 325 MG tablet Take 1 tablet (325 mg total) by mouth daily. 12/04/13   Rosalio Macadamia, NP  bisacodyl (DULCOLAX) 5 MG EC tablet Take 5 mg by mouth daily as needed.      Historical Provider, MD  furosemide (LASIX) 40 MG tablet Take 40 mg by mouth.    Historical Provider, MD  hydrALAZINE (APRESOLINE) 50 MG tablet Take 50 mg by mouth 3 (three) times daily. 10/27/14   Historical Provider, MD  isosorbide mononitrate (IMDUR) 60 MG 24 hr tablet Take 1.5 tablets (90 mg total) by mouth daily. 06/03/14   Rosalio Macadamia, NP  linagliptin (TRADJENTA) 5 MG TABS tablet Take 5 mg by mouth daily.    Historical Provider, MD  metoprolol (LOPRESSOR) 50 MG tablet Take 50 mg by mouth daily. 09/08/14   Historical Provider, MD  nitroGLYCERIN (NITROSTAT) 0.4 MG SL tablet Place 1 tablet (0.4 mg total) under the tongue every 5 (five) minutes as needed. 07/27/11   Vesta Mixer, MD  pantoprazole (PROTONIX) 40 MG tablet Take 40 mg by mouth daily.  05/26/14   Historical Provider, MD  simvastatin (ZOCOR) 20 MG tablet Take 20 mg by mouth daily.  05/31/13   Historical Provider, MD  traMADol (ULTRAM) 50 MG tablet Take 50 mg by mouth every 8 (eight) hours as needed.    Historical Provider, MD   Allergies  Allergen Reactions  . Ibuprofen Hives   CBC:    Component Value Date/Time   WBC 11.1* 09-Dec-2014 0935   HGB 12.6* 2014-12-09 0958   HCT 37.0* Dec 09, 2014 0958   PLT 212 Dec 09, 2014 0935   MCV 80.1 12-09-2014 0935   NEUTROABS 8.7* Dec 09, 2014 0935   LYMPHSABS 1.2 2014-12-09 0935   MONOABS 1.1* December 09, 2014 0935   EOSABS 0.1 12-09-14 0935   BASOSABS 0.1 12/09/2014  0935   Comprehensive Metabolic Panel:    Component Value Date/Time   NA 134* 12/09/14 0958   K 5.6* 12-09-2014 0958   CL 104 2014-12-09 0958   CO2 22 Dec 09, 2014 0935   BUN 38* 2014/12/09 0958   CREATININE 2.50* December 09, 2014 0958   GLUCOSE 220* 12/09/2014 0958   CALCIUM 8.1* 12/09/2014 0935   AST 21 12-09-2014 0935   ALT 10* December 09, 2014 0935   ALKPHOS 52 December 09, 2014 0935   BILITOT 0.5 12/09/2014 0935   PROT 6.3* 2014-12-09 0935   ALBUMIN 3.4* 09-Dec-2014 0935    Physical Exam: Vital Signs: BP 105/31 mmHg  Pulse 60  Resp 17  Ht 6' (1.829 m)  Wt 70.308 kg (155 lb)  BMI 21.02 kg/m2  SpO2 49% SpO2: SpO2: (!) 49 %  O2 Device: O2 Device: NRB O2 Flow Rate: O2 Flow Rate (L/min): 15 L/min Intake/output summary:  Intake/Output Summary (Last 24 hours) at 12/16/2014 1646 Last data filed at 2014-12-16 1145  Gross per 24 hour  Intake     50 ml  Output      0 ml  Net     50 ml   LBM:   Baseline Weight: Weight: 70.308 kg (155 lb) Most recent weight: Weight: 70.308 kg (155 lb)  Exam Findings:  0         Palliative Performance Scale: 0              Additional Data Reviewed: Recent Labs     Dec 16, 2014  0935  2014-12-16  0958  WBC  11.1*   --   HGB  11.2*  12.6*  PLT  212   --   NA  134*  134*  BUN  39*  38*  CREATININE  2.58*  2.50*     Time: 2-3PM Time Total: 60 min Greater than 50%  of this time was spent counseling and coordinating care related to the above assessment and plan.  Signed by: Hilbert Odor, DO  12-16-2014, 4:46 PM  Please contact Palliative Medicine Team phone at [phone] for questions and concerns.

## 2014-12-26 NOTE — ED Notes (Signed)
Dr. Madilyn Hookees at bedside with patient and family.  RN Charge [place]had made aware, pacemaker needs to be interrogated.

## 2014-12-26 NOTE — Discharge Summary (Signed)
Death Summary  Steven Callahan AYT:[id number] DOB: 19-Feb-1921 DOA: 12-28-14  PCP: Precious Reel, MD PCP/Office notified: notified through Early date: 12/28/2014 Date of Death: 12/28/14  Final Diagnoses:  Active Problems:   Tachy-brady syndrome   CAP (community acquired pneumonia)   Acute respiratory failure with hypoxia   NSTEMI (non-ST elevated myocardial infarction)   Acute renal failure superimposed on stage 3 chronic kidney disease   Hyperkalemia   Sepsis   Type 2 diabetes with nephropathy   GERD (gastroesophageal reflux disease)   History of present illness:  81 y.o. male with pmh significant for tachy-brady syndrome, HTN, CAD, diastolic HF (chronic) and atrial fibrillation; who presented to ED secondary to fever, SOB and episode of CP on night prior to admission. Patient very tachypneic and hypoxic on presentation to ED. Denying CP now, but endorses some intermittent episodes in the last week. He was also febrile and CXR demonstrated infiltrates and vascular congestion. His troponin was found to be 5.39.  After discussing with patient, he expressed to me and cardiology service that he do not want to be resuscitated, not looking for aggressive intervention and that he knows he is dying and just want comfort measures. Discussed with wife and daughter at bedside, they confirmed his wishes. Patient admitted for comfort measures and initiated on PRN morphine and ativan. Palliative care consulted.   Hospital Course:  81 yo gentleman with advanced CHF, CAD and A. Fib admitted with weakness, SOB, Nausea and probable MI. Palliative care was called for symptomatic management. When the patient experienced worsening shortness of breath increased demand on oxygen supplementation, became hypotensive, diaphoretic and expired. Most likely cause of death acute myocardial infarction.  Palliative care team and cardiologist (Dr. Burt Knack) met with family and have a complete discussion on probable  cause of death and his very serious prior to admission medical problems. Patient requested to be DO NOT RESUSCITATE/DO NOT INTUBATE right before anything of these happened and was receiving comfort medications.  He expired at 2:10 PM on 28-Dec-2014  Time: 30 minutes  Signed:  Barton Dubois  [facility] 12/28/2014, 5:11 PM

## 2014-12-26 NOTE — ED Notes (Signed)
Cardiology at bedside.

## 2014-12-26 NOTE — ED Notes (Signed)
EMS - Patient coming from home with a LKN of last night at 2100, woke up by wife around 08:00 this morning with garbled speech, no other neuro deficits noted.  On initial room air, patient 76%, placed on 6L at 88%.  Chest pain last night prior to going to bed, took 1 Nitro.  Pacemaker and LBBB.  Fever last night of 100.2.  Fall on Friday, hit head on cement while doing yard work.  CBG 99, 130/89,

## 2014-12-26 NOTE — ED Provider Notes (Signed)
CSN: [id number]     Arrival date & time 12-22-14  0921 History   First MD Initiated Contact with Patient 12/22/14 [phone]     Chief Complaint  Patient presents with  . Transient Ischemic Attack  . Shortness of Breath     (Consider location/radiation/quality/duration/timing/severity/associated sxs/prior Treatment) HPI  Steven Callahan is a 81 y.o. male with PMH of HTN, CAD, Atrial fibrillation on  Aspirin, CHF, DM, AAA presenting via EMS from home with garbled speech per wife who is bedside. Pt last normal at 2100 last night. Wife woke up patient around 8am. Pt had reported chest pain last night that resolved with Nitro. No chest pain or any pain at present. Pt with SOB and O2 of 76% RA and 6L [place] with improvement to 88%. Wife endorses dry cough and fever of 100.2. Also report of multiple episodes similar to this in past 2 weeks with most recent 2 days ago and pt hit head on cement while doing yard work. No evaluation. No blood thinners. No headache, visual changes or reported weakness. Pt takes aspirin 325 daily.    Past Medical History  Diagnosis Date  . Murmur   . 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 HTN (hypertension)   . Carotid artery disease     TOTALLY OCCLUDED LEFT CAROTID  . Coronary artery disease   . CAD (coronary artery disease) 1981  . Atrial fibrillation   . GERD (gastroesophageal reflux disease)   . Anemia     on arinesp  . AAA (abdominal aortic aneurysm)   . CHF (congestive heart failure)   . Diabetes mellitus without complication   . Myocardial infarction     81 yrs old  . Ear problem     left ear. Hear a roaring feels like it is stopped up   Past Surgical History  Procedure Laterality Date  . Nasal septum surgery    . Cataract extraction, bilateral    . Coronary artery bypass graft  1999  . Pacemaker insertion  07/08/10    MDT pacemaker by Dr Deborah Chalk for tachy/brady syndrome   Family History  Problem Relation Age of Onset  .  Stroke Father   . Bone cancer Mother   . Cancer Mother   . Coronary artery disease Child   . Coronary artery disease Child   . Cancer Sister   . Heart disease Daughter     Heart Disease before age 13  . Heart disease Son     Heart Disease before age 30-  AAA   History  Substance Use Topics  . Smoking status: Former Smoker -- 1.00 packs/day    Types: Cigarettes    Quit date: 09/05/1980  . Smokeless tobacco: Never Used  . Alcohol Use: No    Review of Systems 10 Systems reviewed and are negative for acute change except as noted in the HPI.    Allergies  Ibuprofen  Home Medications   Prior to Admission medications   Medication Sig Start Date End Date Taking? Authorizing Provider  ALPRAZolam Prudy Feeler) 0.5 MG tablet Take 0.5 mg by mouth at bedtime.      Historical Provider, MD  amLODipine (NORVASC) 5 MG tablet Take 5 mg by mouth 2 (two) times daily. 09/08/14   Historical Provider, MD  aspirin EC 325 MG tablet Take 1 tablet (325 mg total) by mouth daily. 12/04/13   Rosalio Macadamia, NP  bisacodyl (DULCOLAX) 5 MG EC tablet Take 5 mg  by mouth daily as needed.      Historical Provider, MD  furosemide (LASIX) 40 MG tablet Take 40 mg by mouth.    Historical Provider, MD  hydrALAZINE (APRESOLINE) 50 MG tablet Take 50 mg by mouth 3 (three) times daily. 10/27/14   Historical Provider, MD  isosorbide mononitrate (IMDUR) 60 MG 24 hr tablet Take 1.5 tablets (90 mg total) by mouth daily. 06/03/14   Rosalio MacadamiaLori C Gerhardt, NP  linagliptin (TRADJENTA) 5 MG TABS tablet Take 5 mg by mouth daily.    Historical Provider, MD  metoprolol (LOPRESSOR) 50 MG tablet Take 50 mg by mouth daily. 09/08/14   Historical Provider, MD  nitroGLYCERIN (NITROSTAT) 0.4 MG SL tablet Place 1 tablet (0.4 mg total) under the tongue every 5 (five) minutes as needed. 07/27/11   Vesta MixerPhilip J Nahser, MD  pantoprazole (PROTONIX) 40 MG tablet Take 40 mg by mouth daily.  05/26/14   Historical Provider, MD  simvastatin (ZOCOR) 20 MG tablet Take 20  mg by mouth daily.  05/31/13   Historical Provider, MD  traMADol (ULTRAM) 50 MG tablet Take 50 mg by mouth every 8 (eight) hours as needed.    Historical Provider, MD   BP 105/31 mmHg  Pulse 63  Resp 19  Ht 6' (1.829 m)  Wt 155 lb (70.308 kg)  BMI 21.02 kg/m2  SpO2 49% Physical Exam  Constitutional: He is oriented to person, place, and time. He appears well-developed and well-nourished. He appears distressed.  HENT:  Head: Normocephalic.  Mouth/Throat: Oropharynx is clear and moist.  Eyes: Conjunctivae and EOM are normal. Pupils are equal, round, and reactive to light. Right eye exhibits no discharge. Left eye exhibits no discharge.  Neck: Normal range of motion. Neck supple.  No nuchal rigidity  Cardiovascular: Normal rate and regular rhythm.   Pulmonary/Chest: Effort normal and breath sounds normal. No respiratory distress. He has no wheezes.  BS in all lung fields. Mild to moderate respiratory distress.  Abdominal: Soft. Bowel sounds are normal. He exhibits no distension. There is no tenderness.  Neurological: He is alert and oriented to person, place, and time. No cranial nerve deficit. Coordination normal.  Speech is clear and goal oriented. Strength 5/5 in upper and lower extremities. Sensation intact. Intact rapid alternating movements, finger to nose, and heel to shin. No pronator drift.   Skin: Skin is warm and dry.  Nursing note and vitals reviewed.   ED Course  Procedures (including critical care time) Labs Review Labs Reviewed  CBC - Abnormal; Notable for the following:    WBC 11.1 (*)    Hemoglobin 11.2 (*)    HCT 35.5 (*)    MCH 25.3 (*)    RDW 17.5 (*)    All other components within normal limits  DIFFERENTIAL - Abnormal; Notable for the following:    Neutrophils Relative % 78 (*)    Neutro Abs 8.7 (*)    Lymphocytes Relative 10 (*)    Monocytes Absolute 1.1 (*)    All other components within normal limits  COMPREHENSIVE METABOLIC PANEL - Abnormal; Notable  for the following:    Sodium 134 (*)    Potassium 5.7 (*)    Glucose, Bld 221 (*)    BUN 39 (*)    Creatinine, Ser 2.58 (*)    Calcium 8.1 (*)    Total Protein 6.3 (*)    Albumin 3.4 (*)    ALT 10 (*)    GFR calc non Af Amer 20 (*)  GFR calc Af Amer 23 (*)    All other components within normal limits  I-STAT CHEM 8, ED - Abnormal; Notable for the following:    Sodium 134 (*)    Potassium 5.6 (*)    BUN 38 (*)    Creatinine, Ser 2.50 (*)    Glucose, Bld 220 (*)    Calcium, Ion 1.12 (*)    Hemoglobin 12.6 (*)    HCT 37.0 (*)    All other components within normal limits  I-STAT TROPOININ, ED - Abnormal; Notable for the following:    Troponin i, poc 5.39 (*)    All other components within normal limits  CBG MONITORING, ED - Abnormal; Notable for the following:    Glucose-Capillary 208 (*)    All other components within normal limits  I-STAT ARTERIAL BLOOD GAS, ED - Abnormal; Notable for the following:    pCO2 arterial 34.8 (*)    pO2, Arterial 74.0 (*)    Bicarbonate 19.3 (*)    Acid-base deficit 6.0 (*)    All other components within normal limits  PROTIME-INR  APTT  URINALYSIS, ROUTINE W REFLEX MICROSCOPIC (NOT AT [facility])  BLOOD GAS, ARTERIAL  BRAIN NATRIURETIC PEPTIDE  I-STAT CG4 LACTIC ACID, ED    Imaging Review Ct Head (brain) Wo Contrast  12/13/2014   CLINICAL DATA:  Awoke at 0800 hours today with garbled speech, head chest pain last night for which she took nitroglycerin, last seen normal 2100 hours last night, past history hypertension, coronary artery disease, atrial fibrillation, CHF, diabetes, MI  EXAM: CT HEAD WITHOUT CONTRAST  TECHNIQUE: Contiguous axial images were obtained from the base of the skull through the vertex without intravenous contrast.  COMPARISON:  None  FINDINGS: Generalized atrophy.  Normal ventricular morphology.  No midline shift or mass effect.  Minimal small vessel chronic ischemic changes of deep cerebral white matter.  No intracranial  hemorrhage, mass lesion, or acute infarction.  LEFT greater than RIGHT mastoid effusions/opacification.  Visualized paranasal sinuses clear.  Bones unremarkable.  Atherosclerotic calcifications of internal carotid and vertebral arteries at skullbase.  IMPRESSION: Atrophy with minimal small vessel chronic ischemic changes of deep cerebral white matter.  No acute intracranial abnormalities.  BILATERAL mastoid effusions/opacification LEFT greater than RIGHT, cannot exclude mastoiditis.   Electronically Signed   By: Ulyses Southward M.D.   On: December 13, 2014 12:07   Dg Chest Portable 1 View  December 13, 2014   CLINICAL DATA:  TIA. Speech abnormality. Fever. Shortness of breath.  EXAM: PORTABLE CHEST - 1 VIEW  COMPARISON:  07/06/2011  FINDINGS: Sequelae of prior CABG are again identified. Dual lead pacemaker remains in place. Cardiac silhouette is mildly enlarged. Thoracic aortic calcification is noted. There is new abnormal airspace opacity throughout much of the right lung, greatest in the lung base. There is mild central pulmonary vascular congestion. Mild interstitial opacities are present in the left midlung. No definite pleural effusion or pneumothorax is identified. No acute osseous abnormality is identified.  IMPRESSION: New airspace opacity involving much of the right lung with mild interstitial opacity in the left midlung. Considerations include right lung pneumonia superimposed on mild edema versus asymmetric pulmonary edema accounting for all of the abnormal opacity. Followup PA and lateral chest X-ray is recommended in 3-4 weeks following trial of antibiotic therapy to ensure resolution and exclude underlying malignancy.   Electronically Signed   By: Sebastian Ache   On: 2014/12/13 10:19     EKG Interpretation   Date/Time:  Monday 12-13-2014  09:38:12 EDT Ventricular Rate:  68 PR Interval:  229 QRS Duration: 152 QT Interval:  471 QTC Calculation: 501 R Axis:   -67 Text Interpretation:  Sinus rhythm Prolonged  PR interval Probable left  atrial enlargement Left bundle branch block Confirmed by Lincoln Brigham [phone])  on 09-Dec-2014 9:51:31 AM      Meds given in ED:  Medications  cefTRIAXone (ROCEPHIN) 2 g in dextrose 5 % 50 mL IVPB (0 g Intravenous Stopped 2014/12/09 1145)  morphine 2 MG/ML injection 2 mg (2 mg Intravenous Given 09-Dec-2014 1350)  LORazepam (ATIVAN) injection 0.5 mg (0.5 mg Intravenous Given 2014-12-09 1340)  scopolamine (TRANSDERM-SCOP) 1 MG/3DAYS 1.5 mg (not administered)  pantoprazole (PROTONIX) EC tablet 40 mg (not administered)  levofloxacin (LEVAQUIN) tablet 500 mg (500 mg Oral Given 2014-12-09 1105)  furosemide (LASIX) injection 80 mg (80 mg Intravenous Given December 09, 2014 1341)    New Prescriptions   No medications on file      MDM   Final diagnoses:  Acute respiratory failure with hypoxia  CAP (community acquired pneumonia)  AKI (acute kidney injury)   Pt presenting with SOB, fevers, and garbled speech this morning. Pt hypoxic to 77% on RA and placed on nonrebreather with O2 stats above 94% Mild-moderate respiratory distress. CXR with new opacity involving much of right lung and opacity in left mid lung possibly PNA verses pulmonary edema. Most likely CAP with reported fevers and cough. Pt does have history of CHF. Levaquin and ceftriaxone ordered. Of note patient denied any chest pain today and troponin elevated to 5.39. Cardiology Consulted. Head CT ordered and pending to rule out intracranial pathology.  CT head without acute abnormalities. Consult to hospitalists for admission. Spoke with Dr. Gwenlyn Perking who agrees to evaluate the patient with plan for admission. Pt with stable vitals and no hypoxia on nonrebreather. Pt complaining of no pain.   Pt with desaturations to 86% on rebreather spoke with patient and family about patients difficulty breathing. Family and patient agreeable to Bipap. On reassessment before Bipap administered, patient and family decided not to pursuit any curative  intervention. Further blood work and abx discontinued. Hospitalists consulting palliative care. Palliative ativan and morphine ordered per Dr. Excell Seltzer and patient DNR.  Pt with O2 saturations continuing to decrease. Pt pronounced dead in the department.     Oswaldo Conroy, PA-C Dec 09, 2014 1608  Tilden Fossa, MD 12/02/14 [phone]

## 2014-12-26 NOTE — ED Notes (Signed)
Respiratory tech made aware of patient being placed on Bipap.

## 2014-12-26 NOTE — Consult Note (Signed)
Patient ID: ADAR RASE MRN: [id number], DOB/AGE: 79-Mar-1922   Admit date: 12/10/14   Primary Physician: Gwen Pounds, MD Primary Cardiologist: Dr. Melburn Popper  Pt. Profile:  Steven Callahan is a 81 y.o. male with a history of HTN, CAD s/p remote CABG, paroxysmal  atrial fibrillation on  Aspirin, tachycardia-bradycardia syndrome s/p PPM, CHF, DM, chronic aortic insufficiency, AAA, anemia on aranesp presenting via EMS from home 12-10-14  with garbled speech.   HPI:  PT has CAD status post remote myocardial infarction in 1981. He status post CABG in December, 1999 (LIMA to LAD, saphenous vein graft to the acute marginal, saphenous vein graft to the PDA, saphenous vein graft to the first diagonal). Most recent cath 07/2004  showed three-vessel coronary artery disease with totally occluded right coronary artery, left anterior descending and obtuse marginal with residual stenosis before the large septal perforating branch in the left anterior descending with patent previous graft. Plan made to manage him medically.  Last seen by Norma Fredrickson in clinic 05/2014. At that time he was started on PPI for noctural angina,  imdur increased to  and had normal functioning of PPM. He also had a renal artery stenosis noted on scan from 2013, however denied further intervention in past. He has been on Xarelto in the past but did not want the expense, refused coumadin due to INRs, managed only on aspirin therapy. Has not tolerated amiodarone in the past. Has a known left ICA occlusion and is followed by [facility]. Last seen by Dr. Myra Gianotti 05/2014 and had stable stenosis and plan was to f/u in a year.   Pt has been having unsternal chest pain for the last few months at rest, relieved with nitro. Requires nitro once about every 2-3 weeks. Pt last normal at 2100 last night. Wife woke up patient around 8am. Pt had reported chest pain last night that resolved with Nitro. Pt with SOB and O2 of 76% RA and 6L [place] with  improvement to 88%. Wife endorses dry cough and fever of 100.2. Patient hit head on cement while doing yard work 2 days ago. No LOC, able to get up and more around. No evaluation. He denies palpitaion, headache, visual changes,  reported weakness, nausea, vomitting, malena, bright red blood in stool, PND, orthopnea or dizziness.    CXR showed New airspace opacity involving much of the right lung with mild interstitial opacity in the left midlung. Possible  right lung pneumonia superimposed on mild edema versus asymmetric pulmonary edema accounting for all of the abnormal opacity, exclude underlying malignancy. K of 5.6. Creatinine of 2.50. POC troponin of 5.39. WBC of 11.1. EKG  showed sinus rhythm at rate of 68 prolonged PR interval, LBBB, peaked T wave in V2 and V3. Pending CT of head.   In ED he is started on Rocephin and Levaquin.   Problem List  Past Medical History  Diagnosis Date  . Murmur   . 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 HTN (hypertension)   . Carotid artery disease     TOTALLY OCCLUDED LEFT CAROTID  . Coronary artery disease   . CAD (coronary artery disease) 1981  . Atrial fibrillation   . GERD (gastroesophageal reflux disease)   . Anemia     on arinesp  . AAA (abdominal aortic aneurysm)   . CHF (congestive heart failure)   . Diabetes mellitus without complication   . Myocardial infarction     81 yrs  old  . Ear problem     left ear. Hear a roaring feels like it is stopped up    Past Surgical History  Procedure Laterality Date  . Nasal septum surgery    . Cataract extraction, bilateral    . Coronary artery bypass graft  1999  . Pacemaker insertion  07/08/10    MDT pacemaker by Dr Deborah Chalk for tachy/brady syndrome     Allergies  Allergies  Allergen Reactions  . Ibuprofen Hives     Home Medications  Prior to Admission medications   Medication Sig Start Date End Date Taking? Authorizing Provider  ALPRAZolam Prudy Feeler) 0.5 MG  tablet Take 0.5 mg by mouth at bedtime.      Historical Provider, MD  amLODipine (NORVASC) 10 MG tablet TAKE 1 TABLET (10 MG TOTAL) BY MOUTH DAILY. 01/15/14   Vesta Mixer, MD  aspirin EC 325 MG tablet Take 1 tablet (325 mg total) by mouth daily. 12/04/13   Rosalio Macadamia, NP  bisacodyl (DULCOLAX) 5 MG EC tablet Take 5 mg by mouth daily as needed.      Historical Provider, MD  furosemide (LASIX) 40 MG tablet Take 40 mg by mouth.    Historical Provider, MD  hydrALAZINE (APRESOLINE) 25 MG tablet Take 50 mg by mouth 3 (three) times daily as needed.     Historical Provider, MD  isosorbide mononitrate (IMDUR) 60 MG 24 hr tablet Take 1.5 tablets (90 mg total) by mouth daily. 06/03/14   Rosalio Macadamia, NP  linagliptin (TRADJENTA) 5 MG TABS tablet Take 5 mg by mouth daily.    Historical Provider, MD  metoprolol succinate (TOPROL-XL) 50 MG 24 hr tablet TAKE 1 TABLET (50 MG TOTAL) BY MOUTH DAILY. 01/31/14   Hillis Range, MD  nitroGLYCERIN (NITROSTAT) 0.4 MG SL tablet Place 1 tablet (0.4 mg total) under the tongue every 5 (five) minutes as needed. 07/27/11   Vesta Mixer, MD  pantoprazole (PROTONIX) 40 MG tablet Take 40 mg by mouth daily.  05/26/14   Historical Provider, MD  simvastatin (ZOCOR) 20 MG tablet Take 20 mg by mouth daily.  05/31/13   Historical Provider, MD  traMADol (ULTRAM) 50 MG tablet Take 50 mg by mouth every 8 (eight) hours as needed.    Historical Provider, MD    Family History  Family History  Problem Relation Age of Onset  . Stroke Father   . Bone cancer Mother   . Cancer Mother   . Coronary artery disease Child   . Coronary artery disease Child   . Cancer Sister   . Heart disease Daughter     Heart Disease before age 56  . Heart disease Son     Heart Disease before age 12-  AAA   Family Status  Relation Status Death Age  . Father Deceased 59  . Mother Deceased 61  . Sister Deceased   . Daughter Alive   . Son Alive      Social History  History   Social History    . Marital Status: Married    Spouse Name: N/A  . Number of Children: N/A  . Years of Education: N/A   Occupational History  . Not on file.   Social History Main Topics  . Smoking status: Former Smoker -- 1.00 packs/day    Types: Cigarettes    Quit date: 09/05/1980  . Smokeless tobacco: Never Used  . Alcohol Use: No  . Drug Use: No  . Sexual Activity: Not on  file   Other Topics Concern  . Not on file   Social History Narrative    Physical Exam  Blood pressure 155/36, pulse 64, resp. rate 14, height 6' (1.829 m), weight 155 lb (70.308 kg), SpO2 94 %.  General: Pleasant, frail male, appeares in moderate distress.  Psych: Normal affect. Neuro: Alert and oriented X 3. Moves all extremities spontaneously. HEENT: Normal  Neck: Supple without bruits or JVD. Lungs:  Resp regular and unlabored. Diminished breath sound bibasilar. No ronchi or rales.  Heart: RRR no s3, s4, or murmurs. Abdomen: Soft, non-tender, non-distended, BS + x 4.  Extremities: No clubbing, cyanosis or edema. DP/PT/Radials 2+ and equal bilaterally. Skin: Scattered bruise.   Labs  Lab Results  Component Value Date   WBC 11.1* 2015-02-20   HGB 12.6* 2015-02-20   HCT 37.0* 2015-02-20   MCV 80.1 2015-02-20   PLT 212 2015-02-20    Recent Labs Lab 03-16-2015 0935 03-16-2015 0958  NA 134* 134*  K 5.7* 5.6*  CL 105 104  CO2 22  --   BUN 39* 38*  CREATININE 2.58* 2.50*  CALCIUM 8.1*  --   PROT 6.3*  --   BILITOT 0.5  --   ALKPHOS 52  --   ALT 10*  --   AST 21  --   GLUCOSE 221* 220*   Lab Results  Component Value Date   CHOL  07/06/2010    119        ATP III CLASSIFICATION:  <200     mg/dL   Desirable  [id number]  mg/dL   Borderline High  >=045>=240    mg/dL   High          HDL 48 07/06/2010   LDLCALC  07/06/2010    60        Total Cholesterol/HDL:CHD Risk Coronary Heart Disease Risk Table                     Men   Women  1/2 Average Risk   3.4   3.3  Average Risk       5.0   4.4  2 X Average  Risk   9.6   7.1  3 X Average Risk  23.4   11.0        Use the calculated Patient Ratio above and the CHD Risk Table to determine the patient's CHD Risk.        ATP III CLASSIFICATION (LDL):  <100     mg/dL   Optimal  [id number]  mg/dL   Near or Above                    Optimal  130-159  mg/dL   Borderline  [id number]  mg/dL   High  >956>190     mg/dL   Very High   TRIG 54 21/30/865701/03/2011   No results found for: DDIMER   Radiology/Studies  Dg Chest Portable 1 View  12/16/2014   CLINICAL DATA:  TIA. Speech abnormality. Fever. Shortness of breath.  EXAM: PORTABLE CHEST - 1 VIEW  COMPARISON:  07/06/2011  FINDINGS: Sequelae of prior CABG are again identified. Dual lead pacemaker remains in place. Cardiac silhouette is mildly enlarged. Thoracic aortic calcification is noted. There is new abnormal airspace opacity throughout much of the right lung, greatest in the lung base. There is mild central pulmonary vascular congestion. Mild interstitial opacities are present in the left midlung. No definite pleural effusion or pneumothorax is identified.  No acute osseous abnormality is identified.  IMPRESSION: New airspace opacity involving much of the right lung with mild interstitial opacity in the left midlung. Considerations include right lung pneumonia superimposed on mild edema versus asymmetric pulmonary edema accounting for all of the abnormal opacity. Followup PA and lateral chest X-ray is recommended in 3-4 weeks following trial of antibiotic therapy to ensure resolution and exclude underlying malignancy.   Electronically Signed   By: Sebastian Ache   On: 21-Dec-2014 10:19   Cath 07/2004 CORONARY ARTERIES: The coronary arteries arise and distributed normally.  1. The right coronary artery is totally occluded. 2. The saphenous vein graft to the distal right coronary artery is widely  patent with nice insertion and good distal runoff. 3. The left circumflex has a totally occluded obtuse marginal. The  obtuse  marginal 2 is widely patent. 4. The saphenous vein graft to the obtuse marginal is patent with nice  insertion and good distal runoff. 5. The left main coronary artery has an ostial 50% stenosis. 6. The left anterior descending has diffuse irregularities proximally.  There was a 95% stenosis before a large septal perforating branch and  there is an area of bifurcation between the diagonal and continuation of  the LAD. This is a potential area of ischemia in that there does not  appear to be significant retrograde flow from the distal grafted  diagonal and LAD. 7. Saphenous vein graft to the diagonal is widely patent with nice  insertion and good distal runoff. 8. Left internal mammary artery to the LAD as a free graft with nice  insertion and good distal runoff.  OVERALL IMPRESSION: 1. Normal global left ventricular function with mild inferior basilar  hypokinesia. 2. Three-vessel coronary artery disease with totally occluded right  coronary artery, left anterior descending and obtuse marginal with  residual stenosis before the large septal perforating branch in the left  anterior descending.  DISCUSSION: It is felt that we probably can manage Mr. Crago medically and at his point in time that a stent is not needed in his left anterior descending. There does appear to be some degree of compromise in the left anterior descending and should symptoms persist that sound more ischemic or have objective testing of ischemia, we could try a somewhat complex angioplasty on his left anterior descending to try to improve flow to this septal perforating branch. Overall, I do not think that it is wise to proceed in that direction at this point in time.  ECG  EKG showed sinus rhythm at rate of 68 prolonged PR interval, LBBB, peaked T wave in V2 and V3.   ASSESSMENT AND PLAN   1. NSTEMI - Recently having  more resting anginal pain, relieved with nitro. Currently asymptomatic. POC troponin is 5.39.  -Last cath 07/2004 showed three-vessel coronary artery disease with totally occluded RCA, LAD and obtuse marginal with residual stenosis before the large septal perforating branch in the left anterior descending, with patent graft Medical management since then. - I suspect ACS with worsening anginal pain. However, patient wish no further cath. Plan to manage medically.  - plan start on heparin if normal CT.   2. Paroxismal atrial fibrillation - CHADSVASC score of at least 6. (age 63, CHF 1, HTN 1, vascular disease 1, DM 1). Currently in sinus.  - intolerance to amio. He has been on Xarelto in the past but did not want the expense, refused coumadin due to INRs, managed only on aspirin therapy. - Continue ASA 325mg .  3. CHF (systolic vs diastolic?) - No sign of volume overload. Had normal LV function during cath 2006. Unable to find any echo result. Patient asymptomatic.  - Will get echocardiogram.   4. Tachycardia-bradycardia syndrome - s/p PPM (MDT). Had normal device check 05/2014. He has upcoming appointment 12/10/14. Keep current appointment.   5. Hx of CAD - As above  6. Right lung pneumonia - CXR showed New airspace opacity involving much of the right lung with mild interstitial opacity in the left midlung. Possible  right lung pneumonia superimposed on mild edema versus asymmetric pulmonary edema accounting for all of the abnormal opacity, exclude underlying malignancy. - Received levofloxacin. No on Rocephin. Recommended IM admission for management.  7. Acute renal failure - Likely acute on chronic with stage II to III. (his baseline GFR is 28-32, Current 20). Baseline creatinine 1.7-1.9, current 2.5. No document CKD.  -  Recommended IM admission for management. Avoid nephrotoxic drug.   8. Respiratory failure with hypoxia - presented with O2 of 76% RA and 6L [place] with improvement to 88%.  Breathing improved. Management per IM.   9. Hyperkalemia - K of 5.6. Peaked T wave in V2 to V3. Likely due to AKI. Management per IM.    SignedManson Passey, PA-C 12/15/2014, 11:31 AM Pager [phone]  Patient seen, examined. Available data reviewed. Agree with findings, assessment, and plan as outlined by Chelsea Aus, PA-C. The patient is independently interviewed and examined. His daughter is at the bedside. He has an elderly male in moderate respiratory distress. He is wearing an oxygen nonrebreather and sitting upright in bed. JVP is normal. Lungs are coarse bilaterally with inspiratory rales and rhonchi right greater than left. Heart sounds are distant and regular. Abdomen is soft and nontender. There is no peripheral edema.  I have reviewed the patient's history in detail. Have also reviewed his laboratory and radiographic data. Chest x-ray reviewed as outlined below: IMPRESSION: New airspace opacity involving much of the right lung with mild interstitial opacity in the left midlung. Considerations include right lung pneumonia superimposed on mild edema versus asymmetric pulmonary edema accounting for all of the abnormal opacity. Followup PA and lateral chest X-ray is recommended in 3-4 weeks following trial of antibiotic therapy to ensure resolution and exclude underlying malignancy.  The patient is critically ill with multiorgan dysfunction, including acute respiratory failure, congestive heart failure, evidence of myocardial ischemia, and acute on chronic kidney failure. We discussed treatment options at length. The patient repeatedly says "I just want to go home." He is very clear in that he does not wish for any aggressive medical care. His daughter is at the bedside and confirms his wishes. I feel like this is completely appropriate considering the circumstances. Even if we treat him aggressively, his in hospital mortality is extremely high. Will treat him with morphine and  Ativan for comfort. The [facility] hospitalist team is going to admit him to the hospital. It does seem reasonable to treat him with a dose of IV Lasix to try to help his oxygenation and comfort. A palliative medicine consult will be placed.  Tonny Bollman, M.D. 12/15/14 1:34 PM

## 2014-12-26 NOTE — ED Notes (Signed)
Cardiology at bedside states hold off on the BiPAP at this time as he may be being placed on palliative care. Cardiology aware of O2 sats.

## 2014-12-26 NOTE — ED Notes (Signed)
Patient became increasingly agitated, short of breath, cyanotic, and diaphoretic. Monitor continues to show paced rhythm. O2 sats noted to be in the 50s. Pt placed back on NRB. O2 sats remained in the 50s. Cardiology and family at bedside. Cardiology and family confirmed he was a DNR. Cardiologist to speak to family in consultation room.

## 2014-12-26 NOTE — ED Notes (Addendum)
Per Cardiology Dr. Excell Seltzerooper, patient taken off of Non-rebreather and placed on 4L nasal cannula.  Patient being placed on Pallative Care with family at bedside.  Patient being made a DNR by Dr. Excell Seltzerooper.

## 2014-12-26 NOTE — H&P (Signed)
[facility] History and Physical  Steven Callahan:[id number] DOB: 01/02/1921 DOA: 12/13/2014  Referring physician: Dr. Madilyn Hookees PCP: Gwen PoundsUSSO,JOHN M, MD   Chief Complaint: SOB, CP, fever  HPI: Steven Callahan is a 81 y.o. male with pmh significant for tachy-brady syndrome, HTN, CAD, diastolic HF (chronic) and atrial fibrillation; who presented to ED secondary to fever, SOB and episode of CP on night prior to admission. Patient very tachypneic and hypoxic on presentation to ED. Denying CP now, but endorses some intermittent episodes in the last week. He was also febrile and CXR demonstrated infiltrates and vascular congestion. His troponin was found to be 5.39.  After discussing with patient, he expressed to me and cardiology service that he do not want to be resuscitated, not looking for aggressive intervention and that he knows he is dying and just want comfort measures. Discussed with wife and daughter at bedside, they confirmed his wishes. Patient admitted for comfort measures and initiated on PRN morphine and ativan. Palliative care consulted.   Review of Systems:  Negative except as mentioned on HPI.  Past Medical History  Diagnosis Date  . Murmur   . 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 HTN (hypertension)   . Carotid artery disease     TOTALLY OCCLUDED LEFT CAROTID  . Coronary artery disease   . CAD (coronary artery disease) 1981  . Atrial fibrillation   . GERD (gastroesophageal reflux disease)   . Anemia     on arinesp  . AAA (abdominal aortic aneurysm)   . CHF (congestive heart failure)   . Diabetes mellitus without complication   . Myocardial infarction     81 yrs old  . Ear problem     left ear. Hear a roaring feels like it is stopped up   Past Surgical History  Procedure Laterality Date  . Nasal septum surgery    . Cataract extraction, bilateral    . Coronary artery bypass graft  1999  . Pacemaker insertion  07/08/10    MDT  pacemaker by Dr Deborah Chalkennant for tachy/brady syndrome   Social History:  reports that he quit smoking about 34 years ago. His smoking use included Cigarettes. He smoked 1.00 pack per day. He has never used smokeless tobacco. He reports that he does not drink alcohol or use illicit drugs.  Allergies  Allergen Reactions  . Ibuprofen Hives    Family History  Problem Relation Age of Onset  . Stroke Father   . Bone cancer Mother   . Cancer Mother   . Coronary artery disease Child   . Coronary artery disease Child   . Cancer Sister   . Heart disease Daughter     Heart Disease before age 79  . Heart disease Son     Heart Disease before age 81-  AAA    Prior to Admission medications   Medication Sig Start Date End Date Taking? Authorizing Provider  ALPRAZolam Prudy Feeler(XANAX) 0.5 MG tablet Take 0.5 mg by mouth at bedtime.      Historical Provider, MD  amLODipine (NORVASC) 5 MG tablet Take 5 mg by mouth 2 (two) times daily. 09/08/14   Historical Provider, MD  aspirin EC 325 MG tablet Take 1 tablet (325 mg total) by mouth daily. 12/04/13   Rosalio MacadamiaLori C Gerhardt, NP  bisacodyl (DULCOLAX) 5 MG EC tablet Take 5 mg by mouth daily as needed.      Historical Provider, MD  furosemide (LASIX) 40 MG tablet  Take 40 mg by mouth.    Historical Provider, MD  hydrALAZINE (APRESOLINE) 50 MG tablet Take 50 mg by mouth 3 (three) times daily. 10/27/14   Historical Provider, MD  isosorbide mononitrate (IMDUR) 60 MG 24 hr tablet Take 1.5 tablets (90 mg total) by mouth daily. 06/03/14   Rosalio Macadamia, NP  linagliptin (TRADJENTA) 5 MG TABS tablet Take 5 mg by mouth daily.    Historical Provider, MD  metoprolol (LOPRESSOR) 50 MG tablet Take 50 mg by mouth daily. 09/08/14   Historical Provider, MD  nitroGLYCERIN (NITROSTAT) 0.4 MG SL tablet Place 1 tablet (0.4 mg total) under the tongue every 5 (five) minutes as needed. 07/27/11   Vesta Mixer, MD  pantoprazole (PROTONIX) 40 MG tablet Take 40 mg by mouth daily.  05/26/14   Historical  Provider, MD  simvastatin (ZOCOR) 20 MG tablet Take 20 mg by mouth daily.  05/31/13   Historical Provider, MD  traMADol (ULTRAM) 50 MG tablet Take 50 mg by mouth every 8 (eight) hours as needed.    Historical Provider, MD   Physical Exam: Filed Vitals:   13-Dec-2014 1230 12-13-14 1245 12-13-2014 1300 2014-12-13 1315  BP: 179/39 171/40 172/44 182/45  Pulse: 72 71 75 78  Resp: Height:      Weight:      SpO2: 92% 88% 86% 85%    Wt Readings from Last 3 Encounters:  12-13-14 70.308 kg (155 lb)  09/29/14 70.308 kg (155 lb)  06/09/14 73.483 kg (162 lb)    General:  Appears to be in mild distress secondary to shortness of breath and agitation. Patient denies chest pain at this moment. Warm to touch. Patient able to follow commands and was able to answer questions appropriately  Eyes: PERRL, normal lids, irises & conjunctiva, no icterus ENT: hard of hearing, no erythema, no exudates inside his mouth Neck: no LAD, masses or thyromegaly, mild JVD Cardiovascular: Regula rate, positive SM, no rubs.  Respiratory: diffuse bibasilar crackles and rhonchi; no wheezing. Patient with increase use of accessory muscles and tachypnea. Abdomen: soft, nt,nd Skin: no rash or induration seen on exam Musculoskeletal: no joint swelling Psychiatric: grossly normal mood and affect, speech fluent and appropriate Neurologic: grossly non-focal.          Labs on Admission:  Basic Metabolic Panel:  Recent Labs Lab 2014-12-13 0935 13-Dec-2014 0958  NA 134* 134*  K 5.7* 5.6*  CL 105 104  CO2 22  --   GLUCOSE 221* 220*  BUN 39* 38*  CREATININE 2.58* 2.50*  CALCIUM 8.1*  --    Liver Function Tests:  Recent Labs Lab 12-13-14 0935  AST 21  ALT 10*  ALKPHOS 52  BILITOT 0.5  PROT 6.3*  ALBUMIN 3.4*   CBC:  Recent Labs Lab 12-13-2014 0935 12-13-2014 0958  WBC 11.1*  --   NEUTROABS 8.7*  --   HGB 11.2* 12.6*  HCT 35.5* 37.0*  MCV 80.1  --   PLT 212  --    CBG:  Recent Labs Lab  2014-12-13 0941  GLUCAP 208*    Radiological Exams on Admission: Ct Head (brain) Wo Contrast  12/13/14   CLINICAL DATA:  Awoke at 0800 hours today with garbled speech, head chest pain last night for which she took nitroglycerin, last seen normal 2100 hours last night, past history hypertension, coronary artery disease, atrial fibrillation, CHF, diabetes, MI  EXAM: CT HEAD WITHOUT CONTRAST  TECHNIQUE: Contiguous axial images were obtained from  the base of the skull through the vertex without intravenous contrast.  COMPARISON:  None  FINDINGS: Generalized atrophy.  Normal ventricular morphology.  No midline shift or mass effect.  Minimal small vessel chronic ischemic changes of deep cerebral white matter.  No intracranial hemorrhage, mass lesion, or acute infarction.  LEFT greater than RIGHT mastoid effusions/opacification.  Visualized paranasal sinuses clear.  Bones unremarkable.  Atherosclerotic calcifications of internal carotid and vertebral arteries at skullbase.  IMPRESSION: Atrophy with minimal small vessel chronic ischemic changes of deep cerebral white matter.  No acute intracranial abnormalities.  BILATERAL mastoid effusions/opacification LEFT greater than RIGHT, cannot exclude mastoiditis.   Electronically Signed   By: Ulyses Southward M.D.   On: 12-14-2014 12:07   Dg Chest Portable 1 View  Dec 14, 2014   CLINICAL DATA:  TIA. Speech abnormality. Fever. Shortness of breath.  EXAM: PORTABLE CHEST - 1 VIEW  COMPARISON:  07/06/2011  FINDINGS: Sequelae of prior CABG are again identified. Dual lead pacemaker remains in place. Cardiac silhouette is mildly enlarged. Thoracic aortic calcification is noted. There is new abnormal airspace opacity throughout much of the right lung, greatest in the lung base. There is mild central pulmonary vascular congestion. Mild interstitial opacities are present in the left midlung. No definite pleural effusion or pneumothorax is identified. No acute osseous abnormality is  identified.  IMPRESSION: New airspace opacity involving much of the right lung with mild interstitial opacity in the left midlung. Considerations include right lung pneumonia superimposed on mild edema versus asymmetric pulmonary edema accounting for all of the abnormal opacity. Followup PA and lateral chest X-ray is recommended in 3-4 weeks following trial of antibiotic therapy to ensure resolution and exclude underlying malignancy.   Electronically Signed   By: Sebastian Ache   On: 2014/12/14 10:19    EKG:  Sinus rhythm, left bundle branch block (no new) and peaked T wave in V2 and V3. Positive prolonged QT  Assessment/Plan 1-acute respiratory failure with hypoxia: Secondary to community-acquired pneumonia and acute exacerbation of chronic diastolic heart failure. -After discussing with patient findings and prognosis; and receiving inputs from cardiology service; patient and family decided to pursuit cures comfort measures and to discontinue any active treatment or intervention. -Patient will be admitted to MedSurg bed -Will initiate morphine, Ativan, will place a scopolamine patch and use as needed Duonebs -lasix to help with volume control -PRN antipyretics -At care services has been consulted for symptom management and rehabilitation discontinue any other by mouth medication and also any further blood work.  2-NSTEMI: -POC troponin 5.39 -Plan is for comfort care and to treat with morphine and Ativan  3-CAP (community acquired pneumonia): Initially received antibiotics in the ED and after discussion of plan of care was taken to family and patient decided not to pursuit any curative intervention and antibiotic has been discontinued.  4-Acute renal failure superimposed on stage 3 chronic kidney disease: Most likely due to decreased perfusion with ongoing infection and NSTEMI -Plan of care is for comfort measures -There is no intention for further blood work -Lasix will be used as part of  comfort and alternatives in order to decrease volume in his lungs  5-Hyperkalemia: Appears to be secondary to worsening of his renal function -Will suspect some correction with ongoing treatment with Lasix -At this moment, there is no plans for further blood draws  6-Sepsis: Appears to be secondary to convert acquired pneumonia. -Plan of care is comfort measures  7-Type 2 diabetes with nephropathy: given the fact his plan of  care is to be comfort measures, will stop checking CBGs or controlling at this moment his diet.  8-GERD (gastroesophageal reflux disease): ok to continue PPI  And had history of atrial fibrillation and sinus tachy-brady syndrome: status post pacemaker. Rate controlled.   Active care (Dr. Phillips Odor) Cardiology service (Dr. Excell Seltzer)  Code Status: DO NOT RESUSCITATE/DO NOT INTUBATE DVT Prophylaxis: SCDs Family Communication: Wife and daughter at bedside Disposition Plan: Inpatient, MedSurg, LOS (presumed to be > 2 midnights, but difficult to predict)  Time spent: 60 minutes (50% of the time dedicated to patient face-to-face examination and discussion with him and family members, care coordination and discussion with other specialty services)  Vassie Loll [facility]  Pager [phone]

## 2014-12-26 NOTE — Progress Notes (Signed)
Responded to page to provided support to family.  At bedside is pt.wife of nearly 70 years, a son, a daughter and in-laws. Patient passed away suddenly in Pod [facility].  Dr.Golding spoke with family to give update and entertain family's concerns. Family is at peace and  indicated that this was  [name]   and Darden DatesGod's will. I provided, hospitality, presence, empathetic listening, prayer,  emotional, spiritual and grief support.  I prayed with family and remained with them until they departed. Promoted information sharing between staff and family.   2014/11/22 1500  Clinical Encounter Type  Visited With Patient;Family;Patient and family together;Health care provider  Visit Type Initial;Spiritual support;Death;ED  Referral From Nurse  Spiritual Encounters  Spiritual Needs Prayer;Emotional;Grief support  Stress Factors  Family Stress Factors Exhausted;Loss  Venida JarvisWatlington, Aspyn Warnke, Chaplain,pager [phone]

## 2014-12-26 NOTE — ED Notes (Signed)
Morphine and Ativan administered by Guerry MinorsBrittany Chandler RN.

## 2014-12-26 NOTE — ED Notes (Signed)
PA Ranell PatrickCreech made aware that patients O2 on non-rebreather was in the 80's.  See new orders.

## 2014-12-26 NOTE — ED Notes (Signed)
Checked patient cbg it was [id number] notified RN of blood sugar

## 2014-12-26 DEATH — deceased

## 2015-01-12 ENCOUNTER — Encounter (HOSPITAL_COMMUNITY): Payer: Medicare Other

## 2015-02-16 ENCOUNTER — Encounter (HOSPITAL_COMMUNITY): Payer: Medicare Other

## 2015-03-23 ENCOUNTER — Encounter (HOSPITAL_COMMUNITY): Payer: Medicare Other

## 2015-04-27 ENCOUNTER — Encounter (HOSPITAL_COMMUNITY): Payer: Medicare Other

## 2015-06-01 ENCOUNTER — Encounter (HOSPITAL_COMMUNITY): Payer: Medicare Other

## 2015-06-15 ENCOUNTER — Ambulatory Visit: Payer: Medicare Other | Admitting: Family

## 2015-06-15 ENCOUNTER — Other Ambulatory Visit (HOSPITAL_COMMUNITY): Payer: Medicare Other
